# Patient Record
Sex: Female | Born: 1945
Health system: Southern US, Community
[De-identification: ages and names within clinical notes are randomized; demographics above are authoritative.]

## PROBLEM LIST (undated history)

## (undated) DIAGNOSIS — C4A30 Merkel cell carcinoma of unspecified part of face: Principal | ICD-10-CM

## (undated) DIAGNOSIS — F329 Major depressive disorder, single episode, unspecified: Secondary | ICD-10-CM

## (undated) DIAGNOSIS — M1990 Unspecified osteoarthritis, unspecified site: Secondary | ICD-10-CM

## (undated) DIAGNOSIS — F32A Depression, unspecified: Secondary | ICD-10-CM

## (undated) DIAGNOSIS — E785 Hyperlipidemia, unspecified: Secondary | ICD-10-CM

## (undated) DIAGNOSIS — K219 Gastro-esophageal reflux disease without esophagitis: Secondary | ICD-10-CM

## (undated) HISTORY — DX: Major depressive disorder, single episode, unspecified: F32.9

## (undated) HISTORY — DX: Hyperlipidemia, unspecified: E78.5

## (undated) HISTORY — DX: Unspecified osteoarthritis, unspecified site: M19.90

## (undated) HISTORY — PX: ABDOMINAL HYSTERECTOMY: SHX81

## (undated) HISTORY — PX: SHOULDER SURGERY: SHX246

## (undated) HISTORY — DX: Depression, unspecified: F32.A

## (undated) HISTORY — DX: Merkel cell carcinoma of unspecified part of face: C4A.30

## (undated) HISTORY — DX: Gastro-esophageal reflux disease without esophagitis: K21.9

---

## 1997-01-22 DIAGNOSIS — F329 Major depressive disorder, single episode, unspecified: Secondary | ICD-10-CM | POA: Insufficient documentation

## 1997-12-14 ENCOUNTER — Encounter: Payer: Self-pay | Admitting: Orthopedic Surgery

## 1997-12-14 ENCOUNTER — Ambulatory Visit (HOSPITAL_COMMUNITY): Admission: RE | Admit: 1997-12-14 | Discharge: 1997-12-14 | Payer: Self-pay | Admitting: Orthopedic Surgery

## 2000-05-01 ENCOUNTER — Encounter: Payer: Self-pay | Admitting: Family Medicine

## 2000-05-01 ENCOUNTER — Encounter: Admission: RE | Admit: 2000-05-01 | Discharge: 2000-05-01 | Payer: Self-pay | Admitting: Family Medicine

## 2000-07-18 ENCOUNTER — Encounter: Payer: Self-pay | Admitting: Family Medicine

## 2000-07-18 ENCOUNTER — Encounter: Admission: RE | Admit: 2000-07-18 | Discharge: 2000-07-18 | Payer: Self-pay | Admitting: Family Medicine

## 2004-07-21 ENCOUNTER — Ambulatory Visit: Payer: Self-pay | Admitting: Family Medicine

## 2005-11-09 ENCOUNTER — Ambulatory Visit: Payer: Self-pay | Admitting: Family Medicine

## 2005-11-12 ENCOUNTER — Encounter: Admission: RE | Admit: 2005-11-12 | Discharge: 2005-11-12 | Payer: Self-pay | Admitting: Family Medicine

## 2006-01-22 ENCOUNTER — Encounter: Payer: Self-pay | Admitting: Family Medicine

## 2006-01-22 LAB — CONVERTED CEMR LAB: Pap Smear: NORMAL

## 2006-02-13 ENCOUNTER — Other Ambulatory Visit: Admission: RE | Admit: 2006-02-13 | Discharge: 2006-02-13 | Payer: Self-pay | Admitting: Family Medicine

## 2006-02-14 ENCOUNTER — Ambulatory Visit: Payer: Self-pay | Admitting: Family Medicine

## 2006-02-14 LAB — CONVERTED CEMR LAB
CO2: 31 meq/L (ref 19–32)
Calcium: 9.6 mg/dL (ref 8.4–10.5)
Chloride: 106 meq/L (ref 96–112)
Creatinine, Ser: 0.8 mg/dL (ref 0.4–1.2)
Direct LDL: 148 mg/dL
Eosinophils Relative: 2.6 % (ref 0.0–5.0)
Glucose, Bld: 102 mg/dL — ABNORMAL HIGH (ref 70–99)
H Pylori IgG: POSITIVE — AB
HCT: 40.5 % (ref 36.0–46.0)
MCV: 93.3 fL (ref 78.0–100.0)
Neutrophils Relative %: 54.1 % (ref 43.0–77.0)
RBC: 4.34 M/uL (ref 3.87–5.11)
RDW: 12.6 % (ref 11.5–14.6)
Total CHOL/HDL Ratio: 3.8
Triglycerides: 125 mg/dL (ref 0–149)
WBC: 5.7 10*3/uL (ref 4.5–10.5)

## 2006-02-26 ENCOUNTER — Ambulatory Visit: Payer: Self-pay | Admitting: Internal Medicine

## 2006-03-01 ENCOUNTER — Encounter (INDEPENDENT_AMBULATORY_CARE_PROVIDER_SITE_OTHER): Payer: Self-pay | Admitting: *Deleted

## 2006-03-01 ENCOUNTER — Ambulatory Visit: Payer: Self-pay | Admitting: Internal Medicine

## 2006-03-20 ENCOUNTER — Ambulatory Visit: Payer: Self-pay | Admitting: Internal Medicine

## 2006-03-20 ENCOUNTER — Encounter: Payer: Self-pay | Admitting: Family Medicine

## 2006-03-25 ENCOUNTER — Ambulatory Visit (HOSPITAL_COMMUNITY): Admission: RE | Admit: 2006-03-25 | Discharge: 2006-03-25 | Payer: Self-pay | Admitting: Internal Medicine

## 2006-06-12 ENCOUNTER — Ambulatory Visit: Payer: Self-pay | Admitting: Internal Medicine

## 2006-06-13 ENCOUNTER — Encounter: Admission: RE | Admit: 2006-06-13 | Discharge: 2006-06-13 | Payer: Self-pay | Admitting: Family Medicine

## 2006-06-19 ENCOUNTER — Encounter (INDEPENDENT_AMBULATORY_CARE_PROVIDER_SITE_OTHER): Payer: Self-pay | Admitting: *Deleted

## 2007-02-21 ENCOUNTER — Telehealth (INDEPENDENT_AMBULATORY_CARE_PROVIDER_SITE_OTHER): Payer: Self-pay | Admitting: *Deleted

## 2007-02-25 ENCOUNTER — Encounter: Payer: Self-pay | Admitting: Family Medicine

## 2007-02-25 DIAGNOSIS — G8929 Other chronic pain: Secondary | ICD-10-CM

## 2007-02-25 DIAGNOSIS — K219 Gastro-esophageal reflux disease without esophagitis: Secondary | ICD-10-CM

## 2007-03-11 ENCOUNTER — Ambulatory Visit: Payer: Self-pay | Admitting: Family Medicine

## 2007-03-11 DIAGNOSIS — M810 Age-related osteoporosis without current pathological fracture: Secondary | ICD-10-CM

## 2007-03-11 DIAGNOSIS — Z87891 Personal history of nicotine dependence: Secondary | ICD-10-CM | POA: Insufficient documentation

## 2007-03-11 DIAGNOSIS — E785 Hyperlipidemia, unspecified: Secondary | ICD-10-CM | POA: Insufficient documentation

## 2007-03-12 LAB — CONVERTED CEMR LAB
Alkaline Phosphatase: 70 units/L (ref 39–117)
BUN: 12 mg/dL (ref 6–23)
Basophils Relative: 0.7 % (ref 0.0–1.0)
Bilirubin, Direct: 0.1 mg/dL (ref 0.0–0.3)
CO2: 30 meq/L (ref 19–32)
Cholesterol: 207 mg/dL (ref 0–200)
Direct LDL: 122.8 mg/dL
GFR calc Af Amer: 94 mL/min
Hemoglobin: 13.1 g/dL (ref 12.0–15.0)
Lymphocytes Relative: 25 % (ref 12.0–46.0)
MCHC: 33 g/dL (ref 30.0–36.0)
Monocytes Absolute: 0.5 10*3/uL (ref 0.2–0.7)
Monocytes Relative: 8.3 % (ref 3.0–11.0)
Neutro Abs: 4.2 10*3/uL (ref 1.4–7.7)
Neutrophils Relative %: 63.3 % (ref 43.0–77.0)
Potassium: 4.5 meq/L (ref 3.5–5.1)
Sodium: 141 meq/L (ref 135–145)
TSH: 0.88 microintl units/mL (ref 0.35–5.50)
Total Protein: 6.9 g/dL (ref 6.0–8.3)
VLDL: 14 mg/dL (ref 0–40)

## 2007-03-14 ENCOUNTER — Ambulatory Visit: Payer: Self-pay | Admitting: Family Medicine

## 2007-06-20 ENCOUNTER — Telehealth: Payer: Self-pay | Admitting: Family Medicine

## 2007-07-29 ENCOUNTER — Telehealth: Payer: Self-pay | Admitting: Family Medicine

## 2007-12-04 ENCOUNTER — Telehealth: Payer: Self-pay | Admitting: Family Medicine

## 2008-04-02 ENCOUNTER — Telehealth: Payer: Self-pay | Admitting: Family Medicine

## 2008-08-04 ENCOUNTER — Telehealth: Payer: Self-pay | Admitting: Family Medicine

## 2008-10-04 ENCOUNTER — Telehealth: Payer: Self-pay | Admitting: Family Medicine

## 2008-11-11 ENCOUNTER — Telehealth: Payer: Self-pay | Admitting: Family Medicine

## 2008-12-14 ENCOUNTER — Ambulatory Visit: Payer: Self-pay | Admitting: Family Medicine

## 2008-12-14 DIAGNOSIS — I8390 Asymptomatic varicose veins of unspecified lower extremity: Secondary | ICD-10-CM

## 2008-12-14 DIAGNOSIS — G4700 Insomnia, unspecified: Secondary | ICD-10-CM

## 2008-12-14 DIAGNOSIS — M79609 Pain in unspecified limb: Secondary | ICD-10-CM

## 2008-12-20 LAB — CONVERTED CEMR LAB
AST: 21 units/L (ref 0–37)
Albumin: 4.2 g/dL (ref 3.5–5.2)
BUN: 10 mg/dL (ref 6–23)
CO2: 32 meq/L (ref 19–32)
Chloride: 106 meq/L (ref 96–112)
Cholesterol: 237 mg/dL — ABNORMAL HIGH (ref 0–200)
Creatinine, Ser: 0.7 mg/dL (ref 0.4–1.2)
Direct LDL: 151.1 mg/dL
Eosinophils Absolute: 0.1 10*3/uL (ref 0.0–0.7)
Glucose, Bld: 90 mg/dL (ref 70–99)
HCT: 40.4 % (ref 36.0–46.0)
Lymphs Abs: 2 10*3/uL (ref 0.7–4.0)
MCHC: 33.4 g/dL (ref 30.0–36.0)
MCV: 95.4 fL (ref 78.0–100.0)
Monocytes Absolute: 0.5 10*3/uL (ref 0.1–1.0)
Neutrophils Relative %: 49.2 % (ref 43.0–77.0)
Platelets: 277 10*3/uL (ref 150.0–400.0)
Potassium: 4.5 meq/L (ref 3.5–5.1)
RDW: 12.9 % (ref 11.5–14.6)
TSH: 1.03 microintl units/mL (ref 0.35–5.50)
Total Bilirubin: 0.8 mg/dL (ref 0.3–1.2)
VLDL: 26 mg/dL (ref 0.0–40.0)

## 2008-12-21 LAB — CONVERTED CEMR LAB: Vit D, 25-Hydroxy: 20 ng/mL — ABNORMAL LOW (ref 30–89)

## 2008-12-22 ENCOUNTER — Encounter: Payer: Self-pay | Admitting: Family Medicine

## 2009-01-10 ENCOUNTER — Telehealth: Payer: Self-pay | Admitting: Family Medicine

## 2009-01-11 ENCOUNTER — Encounter: Payer: Self-pay | Admitting: Family Medicine

## 2009-01-22 LAB — HM DEXA SCAN

## 2009-01-26 ENCOUNTER — Encounter: Admission: RE | Admit: 2009-01-26 | Discharge: 2009-01-26 | Payer: Self-pay | Admitting: Family Medicine

## 2009-02-16 ENCOUNTER — Ambulatory Visit: Payer: Self-pay | Admitting: Family Medicine

## 2009-02-16 DIAGNOSIS — E559 Vitamin D deficiency, unspecified: Secondary | ICD-10-CM | POA: Insufficient documentation

## 2009-03-02 ENCOUNTER — Telehealth: Payer: Self-pay | Admitting: Family Medicine

## 2009-03-14 ENCOUNTER — Ambulatory Visit: Payer: Self-pay | Admitting: Family Medicine

## 2009-03-15 ENCOUNTER — Telehealth: Payer: Self-pay | Admitting: Family Medicine

## 2009-03-16 ENCOUNTER — Telehealth: Payer: Self-pay | Admitting: Family Medicine

## 2009-06-22 ENCOUNTER — Ambulatory Visit: Payer: Self-pay | Admitting: Family Medicine

## 2009-06-23 ENCOUNTER — Encounter: Payer: Self-pay | Admitting: Family Medicine

## 2009-06-23 LAB — CONVERTED CEMR LAB: Vit D, 25-Hydroxy: 33 ng/mL (ref 30–89)

## 2009-09-13 ENCOUNTER — Telehealth: Payer: Self-pay | Admitting: Family Medicine

## 2010-02-12 ENCOUNTER — Encounter: Payer: Self-pay | Admitting: Family Medicine

## 2010-02-21 NOTE — Consult Note (Signed)
Summary: [facility]  [facility]   Imported By: Lanelle Bal 01/25/2009 11:54:50  _____________________________________________________________________  External Attachment:    Type:   Image     Comment:   External Document

## 2010-02-21 NOTE — Progress Notes (Signed)
Summary: refill request for ambien  Phone Note Refill Request Message from:  Fax from Pharmacy  Refills Requested: Medication #1:  AMBIEN 10 MG  TABS 1 by mouth at bedtime prn   Last Refilled: 02/14/2009 Faxed request from [facility], phone [phone]  Initial call taken by: Lowella Petties CMA,  March 15, 2009 11:22 AM  Follow-up for Phone Call        px written on EMR for call in  Follow-up by: Judith Part MD,  March 15, 2009 11:35 AM  Additional Follow-up for Phone Call Additional follow up Details #1::        Medication phoned to [facility] pharmacy as instructed. Lewanda Rife LPN  March 15, 2009 12:59 PM     New/Updated Medications: AMBIEN 10 MG  TABS (ZOLPIDEM TARTRATE) 1 by mouth at bedtime prn Prescriptions: AMBIEN 10 MG  TABS (ZOLPIDEM TARTRATE) 1 by mouth at bedtime prn  #30 x 5   Entered by:   Lewanda Rife LPN   Authorized by:   Judith Part MD   Signed by:   Lewanda Rife LPN on 62/37/6283   Method used:   Telephoned to ...       [facility] [phone]* (retail)       [address] [place], [place]  [address]       Ph: [phone]       Fax: [phone]   RxID:   [id number]

## 2010-02-21 NOTE — Progress Notes (Signed)
Summary: [name] [name]  Phone Note Refill Request Message from:  Patient on September 13, 2009 11:33 AM  Refills Requested: Medication #1:  AMBIEN 10 MG  TABS 1 by mouth at bedtime prn Refill request from [facility]. [phone].   Initial call taken by: Melody Comas,  September 13, 2009 11:35 AM  Follow-up for Phone Call        px written on EMR for call in  Follow-up by: Judith Part MD,  September 13, 2009 12:27 PM  Additional Follow-up for Phone Call Additional follow up Details #1::        Rx called to [facility] at [facility] [place] [facility]. Additional Follow-up by: Linde Gillis CMA Dittrich Dull),  September 13, 2009 2:44 PM    Prescriptions: AMBIEN 10 MG  TABS (ZOLPIDEM TARTRATE) 1 by mouth at bedtime prn  #30 x 5   Entered and Authorized by:   Judith Part MD   Signed by:   Linde Gillis CMA (AAMA) on 09/13/2009   Method used:   Telephoned to ...       [facility] [phone]* (retail)       [address] [place], [place]  [address]       Ph: [phone]       Fax: [phone]   RxID:   [id number]

## 2010-02-21 NOTE — Assessment & Plan Note (Signed)
Summary: ROA TO DISCUSS BONE DENSITY  CYD   Vital Signs:  Patient profile:   65 year old female Weight:      191 pounds Temp:     97.9 degrees F oral Pulse rate:   68 / minute Pulse rhythm:   regular BP sitting:   120 / 70  (left arm) Cuff size:   regular  Vitals Entered By: Lowella Petties CMA (February 16, 2009 12:05 PM) CC: Discuss bone density results   History of Present Illness: bone densitty is lower  T score for spine is -3 and fem neck -1.6  on high dose vit D -- good  and taking her calcium  is walking exercise-- and is loosing wt with that   not hunched over   no family histrory  she used to smoke   no high risk climbing or activities  started recently on lamasil for her toenails-- Dr Terri Piedra    Allergies: 1)  ! Neurontin 2)  ! Nsaids  Past History:  Past Surgical History: Last updated: 02/25/2007 TAH/RSO endometriosis R) shoulder surgery DJD lumbar spine 3/00 MRI back 11/99 osteopenia dexa 6/02 gallstone CCY 2007 colonos polyps, tics sedation prob 2/08  Family History: Last updated: 03/11/2007 father DM and lung ca  sister breast ca brother lung ca PGF CAD uncles CAD GM stomach cancer MGF cancer ? type- poss prstate   Social History: Last updated: 03/11/2007 quit smoking 2/07 Former Smoker  Risk Factors: Smoking Status: quit (03/11/2007) Packs/Day: 02/22/2005 (02/25/2007)  Past Medical History: GERD Depression (01/22/1997) Osteoarthritis (11/1997) mild hyperlipidemia fam hx of breast cancer  osteoporosis  onchomycosis  vit D def   derm- [place]   Review of Systems General:  Denies loss of appetite and malaise. Eyes:  Denies blurring. CV:  Denies chest pain or discomfort and palpitations. GI:  Denies abdominal pain, indigestion, and nausea. MS:  Denies joint pain, loss of strength, and cramps. Derm:  Denies lesion(s) and rash. Endo:  Denies cold intolerance and heat intolerance.  Physical Exam  General:  overweight  but generally well appearing  Head:  normocephalic, atraumatic, and no abnormalities observed.   Neck:  No deformities, masses, or tenderness noted. Heart:  Normal rate and regular rhythm. S1 and S2 normal without gallop, murmur, click, rub or other extra sounds. Msk:  no kyphosis pt does not have petite frame  Extremities:  No clubbing, cyanosis, edema, or deformity noted with normal full range of motion of all joints.   Neurologic:  sensation intact to light touch, gait normal, and DTRs symmetrical and normal.   Skin:  Intact without suspicious lesions or rashes Cervical Nodes:  No lymphadenopathy noted Psych:  normal affect, talkative and pleasant    Impression & Recommendations:  Problem # 1:  OSTEOPOROSIS (ICD-733.00) Assessment Deteriorated disc OP and safety issues - handout given from aafp suppl vit D and re check 2 wk disc ca intake and exercise/ wt bearing  start fosamax- update if side eff  dexa plan 2 y Her updated medication list for this problem includes:    Vitamin D (ergocalciferol) 50000 Unit Caps (Ergocalciferol) .Marland Kitchen... Take one by mouth weekly times 10 weeks.    Fosamax 70 Mg Tabs (Alendronate sodium) .Marland Kitchen... 1 by mouth once weekly as directed  Orders: Prescription Created Electronically [phone])  Problem # 2:  UNSPECIFIED VITAMIN D DEFICIENCY (ICD-268.9) Assessment: New finishing 12 wk course of high dose tx  check lab 2 wk and update  Complete Medication List: 1)  Ambien 10  Mg Tabs (Zolpidem tartrate) .Marland Kitchen.. 1 by mouth at bedtime prn 2)  Omeprazole 20 Mg Cpdr (Omeprazole) .... Take one by mouth bid 3)  Vitamin D (ergocalciferol) 50000 Unit Caps (Ergocalciferol) .... Take one by mouth weekly times 10 weeks. 4)  Terbinafine Hcl 250 Mg Tabs (Terbinafine hcl) .... Take one by mouth daily 5)  Fosamax 70 Mg Tabs (Alendronate sodium) .Marland Kitchen.. 1 by mouth once weekly as directed  Patient Instructions: 1)  start fosamax once weekly  2)  if side effects like acid reflux or  trouble swallowing - stop it and update me  3)  will plan next bone density test in 2 years 4)  continue calcium and vitamin D  5)  schedule labs in 2 weeks for vitamin D level for deficiency  6)  work on weight bearing exercise like walking  Prescriptions: FOSAMAX 70 MG TABS (ALENDRONATE SODIUM) 1 by mouth once weekly as directed  #4 x 11   Entered and Authorized by:   Judith Part MD   Signed by:   Judith Part MD on 02/16/2009   Method used:   Electronically to        [id number]* (retail)       [address] [place], [place]  [address]       Ph: [phone]       Fax: [phone]   RxID:   [id number]   Prior Medications (reviewed today): AMBIEN 10 MG  TABS (ZOLPIDEM TARTRATE) 1 by mouth at bedtime prn OMEPRAZOLE 20 MG  CPDR (OMEPRAZOLE) take one by mouth bid VITAMIN D (ERGOCALCIFEROL) 50000 UNIT CAPS (ERGOCALCIFEROL) take one by mouth weekly times 10 weeks. TERBINAFINE HCL 250 MG TABS (TERBINAFINE HCL) take one by mouth daily Current Allergies: ! NEURONTIN ! NSAIDS

## 2010-02-21 NOTE — Progress Notes (Signed)
Summary: Indigestion  Phone Note Call from Patient Call back at [phone]   Caller: Patient Call For: Judith Part MD Summary of Call: Since pt has started Fosamax weekly pt states burps several times a day every day. Omeprazole is not helping. Pt has also tried Tums OTC.Pt uses [facility] on [facility] [address] if pharmacy needed. Please advise.  Initial call taken by: Lewanda Rife LPN,  March 16, 2009 2:54 PM  Follow-up for Phone Call        is it just burping or any trouble swallowing / chest pain/ or heartburn ?  Follow-up by: Judith Part MD,  March 16, 2009 4:43 PM  Additional Follow-up for Phone Call Additional follow up Details #1::        No chest pain and no trouble swallowing. Pt does have heart burn and when she burps it comes up in her throat where she can taste it.Lewanda Rife LPN  March 16, 2009 5:08 PM   I want her to stop the fosamax for 2 weeks and then update me with how symptoms are (hold the fosamax) Additional Follow-up by: Judith Part MD,  March 16, 2009 5:24 PM    Additional Follow-up for Phone Call Additional follow up Details #2::    Patient notified as instructed by telephone. Lewanda Rife LPN  March 16, 2009 5:33 PM   New/Updated Medications: FOSAMAX 70 MG TABS (ALENDRONATE SODIUM) 1 by mouth once weekly as directed (holding 2/23 for heartburn)

## 2010-02-21 NOTE — Miscellaneous (Signed)
Summary: Vitamin D3 2000iu update med list  Medications Added VITAMIN D3 2000 UNIT TABS (CHOLECALCIFEROL) Take 1 tablet by mouth once a day       Clinical Lists Changes  Medications: Added new medication of VITAMIN D3 2000 UNIT TABS (CHOLECALCIFEROL) Take 1 tablet by mouth once a day     Current Allergies: ! NEURONTIN ! NSAIDS

## 2010-02-21 NOTE — Progress Notes (Signed)
Summary: wants labs drawn here  Phone Note Call from Patient Call back at [phone]   Caller: Patient Call For: Judith Part MD Summary of Call: Pt is coming in later this month to have her vitamin d level checked.  She has an order from Dr. Terri Piedra to have her liver checked because she is taking lamisil.  She is asking if we can do the liver tests so that she wont have to go to another lab. Initial call taken by: Lowella Petties CMA,  March 02, 2009 11:29 AM  Follow-up for Phone Call        Jamesetta So - are we allowed to do that? Follow-up by: Judith Part MD,  March 02, 2009 12:03 PM  Additional Follow-up for Phone Call Additional follow up Details #1::        The doctor who ordered labs will need to follow lab results.  The labs performed at this site are for patients who are patients at this site and for follow up and treatment for visits at this site.  I would suggest the patient call the doctor's office who ordered the labs and have them performed at that office. Additional Follow-up by: Clarisa Schools,  March 16, 2009 11:48 AM    Additional Follow-up for Phone Call Additional follow up Details #2::    please let her know this is office manager's policy -- thanks Follow-up by: Judith Part MD,  March 16, 2009 12:13 PM  Additional Follow-up for Phone Call Additional follow up Details #3:: Details for Additional Follow-up Action Taken: Left message for patient to call back.Lewanda Rife LPN  March 16, 2009 2:15 PM   Patient notified as instructed by telephone. Lewanda Rife LPN  March 16, 2009 2:52 PM

## 2010-03-03 ENCOUNTER — Other Ambulatory Visit: Payer: Self-pay | Admitting: Family Medicine

## 2010-03-03 DIAGNOSIS — Z1231 Encounter for screening mammogram for malignant neoplasm of breast: Secondary | ICD-10-CM

## 2010-03-14 ENCOUNTER — Telehealth: Payer: Self-pay | Admitting: Family Medicine

## 2010-03-21 NOTE — Progress Notes (Signed)
Summary: refill request for ambien  Phone Note Refill Request Message from:  Pharmacy  Refills Requested: Medication #1:  AMBIEN 10 MG  TABS 1 by mouth at bedtime prn   Last Refilled: 02/13/2010 Phoned [facility] from [facility].  Phone [phone].  Initial call taken by: Lowella Petties CMA, AAMA,  March 14, 2010 12:06 PM  Follow-up for Phone Call        px written on EMR for call in  Follow-up by: Judith Part MD,  March 14, 2010 2:02 PM  Additional Follow-up for Phone Call Additional follow up Details #1::        Rx called to pharmacy. Additional Follow-up by: Sydell Axon LPN,  March 14, 2010 3:55 PM    Prescriptions: AMBIEN 10 MG  TABS (ZOLPIDEM TARTRATE) 1 by mouth at bedtime prn  #30 x 5   Entered and Authorized by:   Judith Part MD   Signed by:   Judith Part MD on 03/14/2010   Method used:   Telephoned to ...       [facility] [phone]* (retail)       [address] [place], [place]  [address]       Ph: [phone]       Fax: [phone]   RxID:   [id number]

## 2010-03-22 ENCOUNTER — Ambulatory Visit
Admission: RE | Admit: 2010-03-22 | Discharge: 2010-03-22 | Disposition: A | Discharge status: Home / Self Care | Payer: BC Managed Care – PPO | Source: Ambulatory Visit | Attending: Family Medicine | Admitting: Family Medicine

## 2010-03-22 DIAGNOSIS — Z1231 Encounter for screening mammogram for malignant neoplasm of breast: Secondary | ICD-10-CM

## 2010-03-24 ENCOUNTER — Encounter (INDEPENDENT_AMBULATORY_CARE_PROVIDER_SITE_OTHER): Payer: Self-pay | Admitting: *Deleted

## 2010-03-30 NOTE — Miscellaneous (Signed)
Summary: Mammogram to flowsheet   Clinical Lists Changes  Observations: Added new observation of MAMMO DUE: 03/2011 (03/24/2010 10:04) Added new observation of MAMMOGRAM: normal (03/22/2010 10:05)      Preventive Care Screening  Mammogram:    Date:  03/22/2010    Next Due:  03/2011    Results:  normal

## 2010-03-30 NOTE — Letter (Signed)
Summary: Results Follow up Letter  [place] [facility]  [address] [place], [place] [address]   Phone: [phone]  Fax: [phone]    03/24/2010 MRN: [id number]     [name] [name] [address] [place], [place]  [address]       Dear [name] [name],  The following are the results of your recent test(s):  Test         Result    Pap Smear:        Normal _____  Not Normal _____ Comments: ______________________________________________________ Cholesterol: LDL(Bad cholesterol):         Your goal is less than:         HDL (Good cholesterol):       Your goal is more than: Comments:  ______________________________________________________ Mammogram:        Normal __X___  Not Normal _____ Comments: Repeat in 1 year  ___________________________________________________________________ Hemoccult:        Normal _____  Not normal _______ Comments:    _____________________________________________________________________ Other Tests:    We routinely do not discuss normal results over the telephone.  If you desire a copy of the results, or you have any questions about this information we can discuss them at your next office visit.   Sincerely,      Sharilyn Sites for  Dr. Roxy Manns

## 2010-06-06 NOTE — Assessment & Plan Note (Signed)
[facility]                         GASTROENTEROLOGY OFFICE NOTE   NAME:[name], [name] [name]                      MRN:          [id number]  DATE:06/12/2006                            DOB:          1945/09/14    CHIEF COMPLAINT:  Soreness in her neck and throat again.   HISTORY:  [name] [name] was no Nexium b.i.d., which helped her sore throat  symptoms, so I think she did have some reflux.  She still has chronic  right neck pain that she has had for four years, previously evaluated by  Dr. Kathy Breach.  Her formulary would not allow her to continue on  Nexium.  She gets abdominal pain if she eats [place] food, so she is  avoiding that.  There are no other changes.   MEDICATIONS:  Listed and reviewed in the chart.   PAST MEDICAL HISTORY:  1. Globus and sore throat symptoms, responded to Nexium.  2. Incomplete colonoscopy has shown diverticulosis and two tiny rectal      polyps that were hyperplastic.  Plan for a possible CT colonoscopy      or propofol colonoscopy to complete a colonoscopy in one year.      That was her decision.  3. Barium swallow showing marked esophageal dysmotility on March 25, 2006. A small sliding hiatal hernia.  4. Insomnia.  5. H. pylori, previously treated with Prev Pak.  6. Prior hysterectomy.  7. Reflex sympathetic dystrophy in the right arm, secondary to rotator      cuff surgery plus/minus previous injury.  8. Prior cholecystectomy.  9. Dyslipidemia.  10.Osteoarthritis.  11.Obesity.  12.Sleep apnea.  13.Allergy and sinus problems.   PHYSICAL EXAMINATION:  VITAL SIGNS:  Weight 185 pounds, pulse 64, blood  pressure 122/74.   ASSESSMENT:  1. Atypical reflux problems.  It sounds like the sore throat was      reflux, since the Nexium helped.  2. Formulary issues.  3. Chronic right neck pain.  I am not sure what that is from.  4. Otherwise as above.   PLAN:  1. Try Omeprazole 20 mg b.i.d. before breakfast and supper.   If that      is not working in two months, she is to call back and we can go to      Aciphex 20 mg b.i.d.  She wanted to try the most cost-effective      approach first.  2. Continue with plans for a recall about her colon examination next      year.  3. She also wondered if allergies are a part of her problem.  I      suggested that she could try over-the-counter loratadine on a daily      basis, to see if that helps the right neck pain.  To talk to Dr.      Idamae Schuller A. Tower or her otolaryngologist if that is not helpful      further.     Iva Boop, MD,FACG  Electronically Signed    CEG/MedQ  DD: 06/12/2006  DT: 06/12/2006  Job #:  [id number]   cc:   Marne A. Milinda Antis, MD

## 2010-06-09 NOTE — Assessment & Plan Note (Signed)
[facility]                         GASTROENTEROLOGY OFFICE NOTE   NAME:[name] [name], [name] [name]                      MRN:          [id number]  DATE:03/20/2006                            DOB:          08-30-1945    CHIEF COMPLAINT:  Swollen burning throat and followup after colonoscopy.   HISTORY OF PRESENT ILLNESS:  Ms. Giaimo had a very difficult  colonoscopy. I could see the cecum but not enter it. She had some  hyperplastic polyps. She was quite uncomfortable during the procedure  and says that she does not want to do another one of those again. She is  not on any proton pump inhibitors at this time. She took a Prev-pack for  H-pylori positive serology and did feel somewhat better when she was on  that, though did not have complete relief. She had hyperplastic rectal  polyps, as well as sigmoid diverticulosis. She had a fixed left colon.   PAST MEDICAL HISTORY:  See my note of February 26, 2006 and above.   PHYSICAL EXAMINATION:  VITAL SIGNS:  Weight 192 pounds. Pulse 68. Blood  pressure 118/78.   ASSESSMENT:  1. Persistent globus and sore throat symptoms. Question related to      reflux. It is possible, though many times, not.  2. Diverticulosis on colonoscopy. Colonoscopy to the cecum but did not      enter, so the cecum is not entirely cleared.   PLAN:  1. Barium swallow with tablet.  2. Nexium 40 mg b.i.d. for 2 months at least, so see if that helps her      sore throat symptoms, then I will see her back.  3. Call with the barium swallow results.  4. Recall regarding possible CT colonoscopy or propofol colonoscopy in      the next year. She understands that I did not get 100% complete      evaluation of her colon and that it is unlikely that there is a      cancer there. There could be neoplastic lesions that could lead to      colon cancer. She does not want to proceed with any other testing      like barium enema or CT colonoscopy at this  time.     Iva Boop, MD,FACG  Electronically Signed    CEG/MedQ  DD: 03/20/2006  DT: 03/20/2006  Job #: [id number]   cc:   Marne A. Milinda Antis, MD

## 2010-06-09 NOTE — Assessment & Plan Note (Signed)
[facility]                         GASTROENTEROLOGY OFFICE NOTE   NAME:[name], [name] OSTERBERG                      MRN:          [id number]  DATE:02/26/2006                            DOB:          Jul 21, 1945    REASON FOR CONSULTATION:  Stomach pain, burning in throat.   ASSESSMENT:  A 65 year old white woman with some globus sensation,  heartburn, and neck and perhaps some chest pain.  This certainly could  be atypical reflux.  She has had the globus sensation for a number of  years.  It is only on the right side.  She has had heartburn symptoms  and acid reflux symptoms and burning in her throat.  They are not  associated with eating.  They are somewhat better after a recent  cholecystectomy.  She is currently halfway through a course of Prevpac  and cannot really tell me if that has changed things much.  She had  previously taken Zegerid daily and Prevacid daily with some but  incomplete benefit.  There does not seem to be hoarseness or respiratory  difficulty.   She is also of age for colon cancer screening.  Her mother did have  colon polyps.   PLAN:  1. Schedule screening colonoscopy.  2. Finish Prevpac.  3. Reassess after she finishes Prevpac.  She probably needs a course      of a b.i.d. proton pump inhibitor.  If she fails that, then Bravo      pH monitoring would be appropriate.  Would perform an upper      endoscopy and locate the Z-line, and go from there.  There is no      dysphagia, there is no weight loss, there are no worrisome features      otherwise at this point to suggest a need for an upper GI endoscopy      now.  A barium swallow could be useful as far as evaluating the      globus and looking for motility disturbance.   Risks, benefits, indications of colonoscopy are explained.  She  understands and agrees to proceed.   HISTORY:  A 65 year old white woman who has the problems as outlined  above.  She had her gallbladder out  because of some cholelithiasis and  right upper quadrant pain and does feel better with respect to that, but  still has this burning sensation in her throat and sometimes in the  chest.  She does not really regurgitate much.  There is no nausea or  vomiting.  There is no dysphagia.  She was on Prevacid and then Zegerid  as described above, and is now on a Prevpac.  She has seen Dr. Elvera Maria  of otolaryngology because of the globus type sensation and his  examination was negative.  She says when Dr. Milinda Antis palpates her neck she  cannot palpate any problem.  She has some loose bowel movements since  her cholecystectomy that are somewhat better.  There is a remote history  of rectal bleeding.  She has not had a screening colonoscopy.   PAST MEDICAL HISTORY:  1.  Cholecystectomy recently as described above.  2. Hysterectomy.  3. Reflex sympathetic dystrophy in the right arm, related to a rotator      cuff surgery it sounds like, plus an injury.  4. Dyslipidemia.  5. Osteoarthritis.  6. Obesity.  7. Sleep apnea.  8. Allergy and sinus problems.   DRUG ALLERGIES:  None known.   CURRENT MEDICATIONS:  Prevpac and Ambien 10 mg.   FAMILY HISTORY:  Heart disease, prostate cancer, breast cancer,  diabetes.  No colon cancer reported.   SOCIAL HISTORY:  She is married, she is disabled.  Her mother-in-law is  ill and in the final stages of Alzheimer's and may die soon, the patient  indicates.  She has two sons and one daughter.  She quit smoking a year  ago.  There is no significant caffeine; that has been reduced recently,  however.  No alcohol use.   REVIEW OF SYSTEMS:  See medical history form for full details.   PHYSICAL EXAMINATION:  GENERAL:  Reveals an obese, pleasant, middle-aged  white woman.  Height 5 feet 7 inches, weight 189 pounds.  Blood pressure  122/78, pulse 95 and regular.  HEENT:  The eyes are anicteric, mouth free of lesions.  The posterior  pharynx shows some mild  erythema.  NECK:  Supple without mass or thyromegaly.  CHEST:  Clear, resonant.  HEART:  S1, S2.  I hear no rubs or gallops.  ABDOMEN:  Obese, soft, nontender.  No organomegaly or mass.  Note there  are well-healed laparoscopic cholecystectomy scars.  RECTAL:  Deferred.  EXTREMITIES:  Show no peripheral edema in the lower extremities.  The  right upper extremity is notable for decreased tone, decreases strength.  She does not really use that.  LYMPHATIC:  No neck or supraclavicular nodes palpated.  SKIN:  No acute rash.  PSYCHIATRIC:  She is alert and oriented x3.   I appreciate the opportunity to care for this patient.  I have reviewed  recent labs (CBC, CMET, ultrasound report) and office notes form Dr.  Milinda Antis.    Iva Boop, MD,FACG  Electronically Signed   CEG/MedQ  DD: 02/26/2006  DT: 02/26/2006  Job #: [id number]   cc:   Marne A. Milinda Antis, MD

## 2010-09-12 ENCOUNTER — Ambulatory Visit (INDEPENDENT_AMBULATORY_CARE_PROVIDER_SITE_OTHER): Payer: Medicare Other | Admitting: Family Medicine

## 2010-09-12 ENCOUNTER — Other Ambulatory Visit: Payer: Self-pay | Admitting: *Deleted

## 2010-09-12 ENCOUNTER — Encounter: Payer: Self-pay | Admitting: Family Medicine

## 2010-09-12 VITALS — BP 140/80 | HR 94 | Temp 97.6°F | Ht 67.0 in | Wt 193.8 lb

## 2010-09-12 DIAGNOSIS — M722 Plantar fascial fibromatosis: Secondary | ICD-10-CM

## 2010-09-12 MED ORDER — ZOLPIDEM TARTRATE 10 MG PO TABS: 10.0000 mg | ORAL_TABLET | Freq: Every evening | ORAL | Status: DC | PRN

## 2010-09-12 NOTE — Progress Notes (Signed)
  Subjective:    Patient ID: [name] [name], female    DOB: [birth year], 65 y.o.   MRN: [id number]  HPI  [name] [name], a 65 y.o. female presents today in the office for the following:    R heel -- hurts first thing in the morning. After on it for a while will hurt a lot. Has been hurting for about a month.  The patient presents with a 1 mo long history of heel pain. This is notable for worsening pain first thing in the morning when arising and standing after sitting.   Prior foot or ankle fractures: none Prior operations: none Orthotics or bracing: none Medications: none PT or home rehab: none Night splints: no Ice massage: no Ball massage: no  Metatarsal pain: no  The PMH, PSH, Social History, Family History, Medications, and allergies have been reviewed in [facility], and have been updated if relevant.  REVIEW OF SYSTEMS  GEN: No fevers, chills. Nontoxic. Primarily MSK c/o today. MSK: Detailed in the HPI GI: tolerating PO intake without difficulty Neuro: No numbness, parasthesias, or tingling associated. Otherwise the pertinent positives of the ROS are noted above.   PHYSICAL EXAM  Blood pressure 140/80, pulse 94, temperature 97.6 F (36.4 C), temperature source Oral, height 5\' 7"  (1.702 m), weight 193 lb 12.8 oz (87.907 kg), SpO2 97.00%.  GEN: Well-developed,well-nourished,in no acute distress; alert,appropriate and cooperative throughout examination HEENT: Normocephalic and atraumatic without obvious abnormalities. Ears, externally no deformities PULM: Breathing comfortably in no respiratory distress EXT: No clubbing, cyanosis, or edema PSYCH: Normally interactive. Cooperative during the interview. Pleasant. Friendly and conversant. Not anxious or depressed appearing. Normal, full affect.  Echymosis: no Edema: no ROM: full LE B Gait: heel toe, non-antalgic MT pain: no Callus pattern: none Lateral Mall: NT Medial Mall: NT Talus: NT Navicular: NT Calcaneous:  NT Metatarsals: NT 5th MT: NT Phalanges: NT Achilles: NT Plantar Fascia: tender, medial along PF. Pain with forced dorsi Fat Pad: NT Peroneals: NT Post Tib: NT Great Toe: Nml motion Ant Drawer: neg Other foot breakdown: none Long arch: preserved Hindfoot breakdown: none Sensation: intact  A/P: Plantar fascitis: We reviewed that stretching is critically important to the treatment of PF. Reviewed footwear. Rigid soles have been shown to help with PF.Marland Kitchen Reviewed rehab of stretching and calf raises.      Review of Systems     Objective:   Physical Exam        Assessment & Plan:

## 2010-09-12 NOTE — Telephone Encounter (Signed)
Medication phoned to [facility] on [address] as instructed. Patient notified as instructed by telephone.

## 2010-09-12 NOTE — Patient Instructions (Signed)
Please read handouts on Plantar Fascitis.  STRETCHING and Strengthening program critically important.  Strengthening on foot and calf muscles as seen in handout. Calf raises, 2 legged, then 1 legged. Foot massage with tennis ball. Ice massage.  Towel Scrunches: get a towel or hand towel, use toes to pick up and scrunch up the towel.  Marble pick-ups, practice picking up marbles with toes and placing into a cup  NEEDS TO BE DONE EVERY DAY  Recommended over the counter insoles. (Spenco or Hapad)  A rigid shoe with good arch support helps: Dansko (great), Lelon Frohlich, Clarks No easily bendable shoes.   Tuli's heel cups

## 2010-09-12 NOTE — Telephone Encounter (Signed)
Px written for call in   

## 2010-09-12 NOTE — Telephone Encounter (Signed)
Patient request refill

## 2010-10-13 ENCOUNTER — Telehealth: Payer: Self-pay | Admitting: *Deleted

## 2010-10-13 MED ORDER — ZOLPIDEM TARTRATE 10 MG PO TABS: 10.0000 mg | ORAL_TABLET | Freq: Every evening | ORAL | Status: DC | PRN

## 2010-10-13 NOTE — Telephone Encounter (Signed)
Px written for call in   

## 2010-10-13 NOTE — Telephone Encounter (Signed)
Medication phoned to pharmacy.  

## 2010-10-17 NOTE — Telephone Encounter (Signed)
[facility]olpidem script cancelled [facility].  Pt is in [facility] and actually wanted the script sent there.  [facility] in [place], they will fill once and transfer the 2 remaining [facility] [address].

## 2011-01-15 ENCOUNTER — Other Ambulatory Visit: Payer: Self-pay | Admitting: Internal Medicine

## 2011-01-15 MED ORDER — ZOLPIDEM TARTRATE 10 MG PO TABS: 10.0000 mg | ORAL_TABLET | Freq: Every evening | ORAL | Status: DC | PRN

## 2011-01-15 NOTE — Telephone Encounter (Signed)
Px written for call in   

## 2011-01-15 NOTE — Telephone Encounter (Signed)
[facility] sent this refill request Phone# [phone]

## 2011-01-15 NOTE — Telephone Encounter (Signed)
Medication phoned to Marylene Land at [facility] as instructed.

## 2011-02-15 ENCOUNTER — Other Ambulatory Visit: Payer: Self-pay | Admitting: *Deleted

## 2011-02-15 MED ORDER — ZOLPIDEM TARTRATE 10 MG PO TABS: 10.0000 mg | ORAL_TABLET | Freq: Every evening | ORAL | Status: DC | PRN

## 2011-02-15 NOTE — Telephone Encounter (Signed)
Medication phoned to [facility] as instructed with note pt needs to call for appt.

## 2011-02-15 NOTE — Telephone Encounter (Signed)
OK to refill? Requests refill to [facility] in [place].

## 2011-02-15 NOTE — Telephone Encounter (Signed)
Medication phoned to pharmacy.  Patient advised to schedule appt. 

## 2011-02-15 NOTE — Telephone Encounter (Signed)
I do not think I have seen her in quite a while - ? 2011 let me know if I'm wrong Will refil once Px written for call in  -- but no further refills until she follows up please- let her know

## 2011-03-12 ENCOUNTER — Encounter: Payer: Self-pay | Admitting: Family Medicine

## 2011-03-12 ENCOUNTER — Ambulatory Visit (INDEPENDENT_AMBULATORY_CARE_PROVIDER_SITE_OTHER): Payer: Medicare Other | Admitting: Family Medicine

## 2011-03-12 VITALS — BP 138/72 | HR 96 | Temp 97.8°F | Ht 67.0 in | Wt 197.2 lb

## 2011-03-12 DIAGNOSIS — G4700 Insomnia, unspecified: Secondary | ICD-10-CM

## 2011-03-12 MED ORDER — ZOLPIDEM TARTRATE 10 MG PO TABS: 10.0000 mg | ORAL_TABLET | Freq: Every evening | ORAL | Status: DC | PRN

## 2011-03-12 NOTE — Patient Instructions (Signed)
Here is refil of ambien -- use with caution If you are interested in a shingles/zoster vaccine - call your insurance to check on coverage,( you should not get it within 1 month of other vaccines) , then call us for a prescription  for it to take to a pharmacy that gives the shot or get here   don't forget to schedule your mammogram Schedule annual exam this summer when you are back

## 2011-03-12 NOTE — Progress Notes (Signed)
Subjective:    Patient ID: [name] [name], female    DOB: 1945-07-19, 66 y.o.   MRN: [id number]  HPI Here for f/u of insomnia and other chronic problems  Is feeling good  Nothing new going on   Does not see gyn  Had mammo 1 year ago  Lives [place] - looking for a house here (uses daughter's address)  bp is 138/72 Wt is up 4 lb with bmi of 30 Diet - is eating about the same Exercise- not as active as she was (plantar fasciitis)   Insomnia-- uses [place]- needs it every night  Severe insomnia 3-4 hours  Nothing else  Pays for it out of pocket  Has tried all otc med No side effects from the [place] Mood is good   Zoster status- is interested in vaccine if covered Knows she is due for health mt exam   Patient Active Problem List  Diagnoses  . UNSPECIFIED VITAMIN D DEFICIENCY  . HYPERLIPIDEMIA  . INSOMNIA, CHRONIC  . DEPRESSION  . OTHER CHRONIC PAIN  . VARICOSE VEINS, LOWER EXTREMITIES  . GERD  . LEG PAIN, BILATERAL  . OSTEOPOROSIS  . TOBACCO USE, QUIT   Past Medical History  Diagnosis Date  . GERD (gastroesophageal reflux disease)   . Depression   . Arthritis   . Hyperlipidemia   . Osteoporosis   . Vitamin d deficiency    Past Surgical History  Procedure Date  . Abdominal hysterectomy   . Shoulder surgery     right   History  Substance Use Topics  . Smoking status: Former Smoker    Quit date: 01/22/2005  . Smokeless tobacco: Not on file  . Alcohol Use: Not on file   Family History  Problem Relation Age of Onset  . Diabetes Father   . Cancer Father     lung cancer  . Cancer Sister     breast cancer  . Cancer Brother     lung cancer  . Coronary artery disease Paternal Uncle   . Cancer Maternal Grandfather     possible prostate  . Coronary artery disease Paternal Grandfather   . Heart disease Paternal Grandfather     CAD   Allergies  Allergen Reactions  . Gabapentin     REACTION: nausea  . Nsaids     REACTION: GI upset   Current  Outpatient Prescriptions on File Prior to Visit  Medication Sig Dispense Refill  . cholecalciferol (VITAMIN D) 1000 UNITS tablet Take 2,000 Units by mouth daily.           Review of Systems Review of Systems  Constitutional: Negative for fever, appetite change, and unexpected weight change. pos for fatigue from lack of sleep Eyes: Negative for pain and visual disturbance.  Respiratory: Negative for cough and shortness of breath.   Cardiovascular: Negative for cp or palpitations    Gastrointestinal: Negative for nausea, diarrhea and constipation.  Genitourinary: Negative for urgency and frequency.  Skin: Negative for pallor or rash   Neurological: Negative for weakness, light-headedness, numbness and headaches. pos for difficulty sleeping  Hematological: Negative for adenopathy. Does not bruise/bleed easily.  Psychiatric/Behavioral: Negative for dysphoric mood. The patient is not nervous/anxious.  pos for stressors        Objective:   Physical Exam  Constitutional: She appears well-developed and well-nourished. No distress.       overwt and well appearing   HENT:  Head: Normocephalic and atraumatic.  Mouth/Throat: Oropharynx is clear and moist.  Eyes: Conjunctivae and EOM are normal. Pupils are equal, round, and reactive to light. No scleral icterus.  Neck: Normal range of motion. Neck supple. No JVD present. Carotid bruit is not present. No thyromegaly present.  Cardiovascular: Normal rate, regular rhythm, normal heart sounds and intact distal pulses.  Exam reveals no gallop.   Pulmonary/Chest: Effort normal and breath sounds normal. No respiratory distress. She has no wheezes.  Abdominal: Soft. Bowel sounds are normal. She exhibits no distension and no abdominal bruit.  Musculoskeletal: She exhibits no edema.  Lymphadenopathy:    She has no cervical adenopathy.  Neurological: She is alert. She has normal reflexes. She exhibits normal muscle tone. Coordination normal.       No  tremor   Skin: Skin is warm and dry. No pallor.  Psychiatric: She has a normal mood and affect. Her behavior is normal.       Pleasant and talkative           Assessment & Plan:

## 2011-03-12 NOTE — Assessment & Plan Note (Signed)
Pt takes [place] daily- works well  No otc meds work No side eff Disc imp of exercise and caff avoidance and good sleep hygiene for insomnia  Overall doing well  Med refilled 6 mo  Summer f/u

## 2011-03-20 ENCOUNTER — Other Ambulatory Visit: Payer: Self-pay | Admitting: *Deleted

## 2011-03-20 NOTE — Telephone Encounter (Signed)
Received fax from pharmacy wanting to verify Rx for Ambien because patient wants this Rx filled out of stated.  Spoke to the pharmacist and the Rx is legit for Ambien 10mg .  They will just fill this one time only.

## 2011-08-24 ENCOUNTER — Other Ambulatory Visit: Payer: Self-pay | Admitting: Family Medicine

## 2011-08-24 DIAGNOSIS — Z1231 Encounter for screening mammogram for malignant neoplasm of breast: Secondary | ICD-10-CM

## 2011-08-27 ENCOUNTER — Other Ambulatory Visit: Payer: Medicare Other

## 2011-08-29 ENCOUNTER — Encounter: Payer: Medicare Other | Admitting: Family Medicine

## 2011-09-14 ENCOUNTER — Telehealth: Payer: Self-pay | Admitting: Family Medicine

## 2011-09-14 ENCOUNTER — Other Ambulatory Visit (INDEPENDENT_AMBULATORY_CARE_PROVIDER_SITE_OTHER): Payer: Medicare Other

## 2011-09-14 ENCOUNTER — Ambulatory Visit
Admission: RE | Admit: 2011-09-14 | Discharge: 2011-09-14 | Disposition: A | Discharge status: Home / Self Care | Payer: Medicare Other | Source: Ambulatory Visit | Attending: Family Medicine | Admitting: Family Medicine

## 2011-09-14 DIAGNOSIS — E559 Vitamin D deficiency, unspecified: Secondary | ICD-10-CM

## 2011-09-14 DIAGNOSIS — Z1231 Encounter for screening mammogram for malignant neoplasm of breast: Secondary | ICD-10-CM

## 2011-09-14 DIAGNOSIS — K219 Gastro-esophageal reflux disease without esophagitis: Secondary | ICD-10-CM

## 2011-09-14 DIAGNOSIS — M810 Age-related osteoporosis without current pathological fracture: Secondary | ICD-10-CM

## 2011-09-14 DIAGNOSIS — E785 Hyperlipidemia, unspecified: Secondary | ICD-10-CM

## 2011-09-14 LAB — COMPREHENSIVE METABOLIC PANEL
Albumin: 3.8 g/dL (ref 3.5–5.2)
Alkaline Phosphatase: 63 U/L (ref 39–117)
BUN: 16 mg/dL (ref 6–23)
Creatinine, Ser: 0.7 mg/dL (ref 0.4–1.2)
Glucose, Bld: 95 mg/dL (ref 70–99)
Potassium: 4.9 mEq/L (ref 3.5–5.1)

## 2011-09-14 LAB — CBC WITH DIFFERENTIAL/PLATELET
Basophils Relative: 1 % (ref 0.0–3.0)
Eosinophils Absolute: 0.2 10*3/uL (ref 0.0–0.7)
Eosinophils Relative: 3.5 % (ref 0.0–5.0)
Hemoglobin: 13 g/dL (ref 12.0–15.0)
MCHC: 32.4 g/dL (ref 30.0–36.0)
MCV: 94.3 fl (ref 78.0–100.0)
Monocytes Absolute: 0.7 10*3/uL (ref 0.1–1.0)
Neutro Abs: 2.5 10*3/uL (ref 1.4–7.7)
RBC: 4.24 Mil/uL (ref 3.87–5.11)
WBC: 5.5 10*3/uL (ref 4.5–10.5)

## 2011-09-14 LAB — LDL CHOLESTEROL, DIRECT: Direct LDL: 123.3 mg/dL

## 2011-09-14 NOTE — Telephone Encounter (Signed)
Message copied by Judy Pimple on Fri Sep 14, 2011  7:39 AM ------      Message from: Alvina Chou      Created: Tue Sep 11, 2011 11:04 AM      Regarding: Lab orders for Friday, 8.23.13       Patient is scheduled for CPX labs, please order future labs, Thanks , Camelia Eng

## 2011-09-15 LAB — VITAMIN D 25 HYDROXY (VIT D DEFICIENCY, FRACTURES): Vit D, 25-Hydroxy: 31 ng/mL (ref 30–89)

## 2011-09-18 ENCOUNTER — Ambulatory Visit (INDEPENDENT_AMBULATORY_CARE_PROVIDER_SITE_OTHER): Payer: Medicare Other | Admitting: Family Medicine

## 2011-09-18 ENCOUNTER — Encounter: Payer: Self-pay | Admitting: Family Medicine

## 2011-09-18 VITALS — BP 128/66 | HR 80 | Temp 97.6°F | Ht 66.0 in | Wt 195.8 lb

## 2011-09-18 DIAGNOSIS — E559 Vitamin D deficiency, unspecified: Secondary | ICD-10-CM

## 2011-09-18 DIAGNOSIS — E785 Hyperlipidemia, unspecified: Secondary | ICD-10-CM

## 2011-09-18 DIAGNOSIS — M79609 Pain in unspecified limb: Secondary | ICD-10-CM

## 2011-09-18 DIAGNOSIS — K219 Gastro-esophageal reflux disease without esophagitis: Secondary | ICD-10-CM

## 2011-09-18 DIAGNOSIS — M810 Age-related osteoporosis without current pathological fracture: Secondary | ICD-10-CM

## 2011-09-18 DIAGNOSIS — Z1211 Encounter for screening for malignant neoplasm of colon: Secondary | ICD-10-CM | POA: Insufficient documentation

## 2011-09-18 MED ORDER — TRAMADOL HCL 50 MG PO TABS: 50.0000 mg | ORAL_TABLET | Freq: Three times a day (TID) | ORAL | Status: AC | PRN

## 2011-09-18 MED ORDER — ZOLPIDEM TARTRATE 10 MG PO TABS: 10.0000 mg | ORAL_TABLET | Freq: Every evening | ORAL | Status: DC | PRN

## 2011-09-18 NOTE — Patient Instructions (Addendum)
Please do stool card for colon cancer screening  If you are interested in shingles vaccine in future - call your insurance company to see how coverage is and call us to schedule  Call back when you are ready to schedule your GI visit (to discuss colonoscopy) and also dexa (bone density test) Increase your daily over the counter vitamin D to 3000 iu per day Avoid red meat/ fried foods/ egg yolks/ fatty breakfast meats/ butter, cheese and high fat dairy/ and shellfish  - for cholesterol control Try tramadol occasionally for pain - do not mix with your Remus Loffler

## 2011-09-18 NOTE — Assessment & Plan Note (Signed)
Stable on omeprazole bid  She gets this otc  Will keep an eye on bone density

## 2011-09-18 NOTE — Progress Notes (Signed)
Subjective:    Patient ID: [name] [name], female    DOB: 12/20/1945, 66 y.o.   MRN: [id number]  HPI Here for check up of chronic medical conditions and to review health mt list  Summer has been great   No new medical questions - same chronic conditions  She has chronic back and leg pain- deals with that well  She tried her husband's tramadol for pain - as long as she does not mix it with the [place] was this mo- just came back neg Self exam-no lumps or changes  Has had a hysterectomy No gyn problems that she knows of - no itch or bleeding or dryiness   colonosc 2/08- had trouble with sedation at that time - and did not finish it  Not ready to do that yet -- is moving  Will do IFOB in the meantime   Zoster status - is interested in the vaccine  She will check with her insurance   dexa 2011 - is due for 2 year follow up  Pt is moving - so will call to sched that  OP Vit D level is ok-- 31 low level of normal  , taking ca also No fractures   Wt is stable with bmi of 31     Chemistry      Component Value Date/Time   NA 141 09/14/2011 0915   K 4.9 09/14/2011 0915   CL 108 09/14/2011 0915   CO2 29 09/14/2011 0915   BUN 16 09/14/2011 0915   CREATININE 0.7 09/14/2011 0915      Component Value Date/Time   CALCIUM 9.4 09/14/2011 0915   ALKPHOS 63 09/14/2011 0915   AST 17 09/14/2011 0915   ALT 14 09/14/2011 0915   BILITOT 0.5 09/14/2011 0915     Lab Results  Component Value Date   CHOL 210* 09/14/2011   HDL 68.30 09/14/2011   LDLDIRECT 123.3 09/14/2011   TRIG 82.0 09/14/2011   CHOLHDL 3 09/14/2011   chol ok -she does not watch diet   Lab Results  Component Value Date   WBC 5.5 09/14/2011   HGB 13.0 09/14/2011   HCT 40.0 09/14/2011   MCV 94.3 09/14/2011   PLT 282.0 09/14/2011   Lab Results  Component Value Date   TSH 2.09 09/14/2011    Patient Active Problem List  Diagnosis  . UNSPECIFIED VITAMIN D DEFICIENCY  . HYPERLIPIDEMIA  . INSOMNIA, CHRONIC  . DEPRESSION    . OTHER CHRONIC PAIN  . VARICOSE VEINS, LOWER EXTREMITIES  . GERD  . LEG PAIN, BILATERAL  . OSTEOPOROSIS  . TOBACCO USE, QUIT   Past Medical History  Diagnosis Date  . GERD (gastroesophageal reflux disease)   . Depression   . Arthritis   . Hyperlipidemia   . Osteoporosis   . Vitamin d deficiency    Past Surgical History  Procedure Date  . Abdominal hysterectomy   . Shoulder surgery     right   History  Substance Use Topics  . Smoking status: Former Smoker    Quit date: 01/22/2005  . Smokeless tobacco: Not on file  . Alcohol Use: No   Family History  Problem Relation Age of Onset  . Diabetes Father   . Cancer Father     lung cancer  . Cancer Sister     breast cancer  . Cancer Brother     lung cancer  . Coronary artery disease Paternal Uncle   . Cancer Maternal Grandfather  possible prostate  . Coronary artery disease Paternal Grandfather   . Heart disease Paternal Grandfather     CAD   Allergies  Allergen Reactions  . Gabapentin     REACTION: nausea  . Nsaids     REACTION: GI upset   Current Outpatient Prescriptions on File Prior to Visit  Medication Sig Dispense Refill  . Calcium-Phosphorus-Vitamin D (CALCIUM GUMMIES PO) Take 1,000 mg/kg by mouth daily.      . cholecalciferol (VITAMIN D) 1000 UNITS tablet Take 2,000 Units by mouth daily.        Marland Kitchen omeprazole (PRILOSEC) 20 MG capsule Take 20 mg by mouth 2 (two) times daily.      Marland Kitchen zolpidem (AMBIEN) 10 MG tablet Take 1 tablet (10 mg total) by mouth at bedtime as needed for sleep.  30 tablet  5  . NON FORMULARY Women's ultra mega bone density take 2 capsules  By mouth twice a day.         Review of Systems Review of Systems  Constitutional: Negative for fever, appetite change, fatigue and unexpected weight change.  Eyes: Negative for pain and visual disturbance.  Respiratory: Negative for cough and shortness of breath.   Cardiovascular: Negative for cp or palpitations    Gastrointestinal: Negative  for nausea, diarrhea and constipation.  Genitourinary: Negative for urgency and frequency.  Skin: Negative for pallor or rash   MSK pos for chronic back and leg pain from arthritis  Neurological: Negative for weakness, light-headedness, numbness and headaches.  Hematological: Negative for adenopathy. Does not bruise/bleed easily.  Psychiatric/Behavioral: Negative for dysphoric mood. The patient is not nervous/anxious.         Objective:   Physical Exam  Constitutional: She appears well-developed and well-nourished. No distress.       obese and well appearing   HENT:  Head: Normocephalic and atraumatic.  Right Ear: External ear normal.  Left Ear: External ear normal.  Nose: Nose normal.  Mouth/Throat: Oropharynx is clear and moist. No oropharyngeal exudate.  Eyes: Conjunctivae and EOM are normal. Pupils are equal, round, and reactive to light. Right eye exhibits no discharge. Left eye exhibits no discharge. No scleral icterus.  Neck: Normal range of motion. Neck supple. No JVD present. Carotid bruit is not present. No thyromegaly present.  Cardiovascular: Normal rate, regular rhythm, normal heart sounds and intact distal pulses.  Exam reveals no gallop.   Pulmonary/Chest: Effort normal and breath sounds normal. No respiratory distress. She has no wheezes.  Abdominal: Soft. Bowel sounds are normal. She exhibits no distension, no abdominal bruit and no mass. There is no tenderness.  Genitourinary: No breast swelling, tenderness, discharge or bleeding.       Breast exam: No mass, nodules, thickening, tenderness, bulging, retraction, inflamation, nipple discharge or skin changes noted.  No axillary or clavicular LA.  Chaperoned exam.    Musculoskeletal: Normal range of motion. She exhibits no edema and no tenderness.  Lymphadenopathy:    She has no cervical adenopathy.  Neurological: She is alert. She has normal reflexes. No cranial nerve deficit. She exhibits normal muscle tone.  Coordination normal.  Skin: Skin is warm and dry. No rash noted. No erythema. No pallor.       Solar lentigos diffusely   Psychiatric: She has a normal mood and affect.          Assessment & Plan:

## 2011-09-18 NOTE — Assessment & Plan Note (Signed)
D level borderline low at 31 with hx of OP  Will inc her otc to 3000 iu daily Also some outdoor time Continue to follow

## 2011-09-18 NOTE — Assessment & Plan Note (Signed)
Disc goals for lipids and reasons to control them Rev labs with pt Rev low sat fat diet in detail   

## 2011-09-18 NOTE — Assessment & Plan Note (Signed)
Thought to be due to OA in back Also has varicosities Trial of tramadol with caution on days when pain is more severe

## 2011-09-18 NOTE — Assessment & Plan Note (Signed)
IFOB today  Pt wants to schedule screening colonosc after she moves  Due to problems with sedation last time - will need GI consult before hand

## 2011-09-18 NOTE — Assessment & Plan Note (Signed)
Due for 2 y dexa Pt will call to sched that after she moves in [place] Disc ca and D Will inc D to 3000 iu daily

## 2011-09-20 ENCOUNTER — Other Ambulatory Visit: Payer: Self-pay

## 2011-09-20 NOTE — Telephone Encounter (Signed)
[facility] from [facility] request refill Tramadol; advised Tramadol and zolpidem sent electronically to [facility] in [place]. [facility] will get [facility] to [facility].

## 2011-10-16 ENCOUNTER — Other Ambulatory Visit: Payer: Medicare Other

## 2011-10-16 DIAGNOSIS — Z1211 Encounter for screening for malignant neoplasm of colon: Secondary | ICD-10-CM

## 2011-10-16 LAB — FECAL OCCULT BLOOD, IMMUNOCHEMICAL: Fecal Occult Bld: NEGATIVE

## 2012-02-14 ENCOUNTER — Other Ambulatory Visit: Payer: Self-pay

## 2012-02-14 NOTE — Telephone Encounter (Signed)
Pt left note requesting refill on Ambien. Spoke with April at [facility]; pt still has refill [facility]. April will get refilled transferred to [facility] pharmacy.pt verbalized understanding.

## 2012-03-14 ENCOUNTER — Other Ambulatory Visit: Payer: Self-pay | Admitting: Family Medicine

## 2012-03-14 MED ORDER — ZOLPIDEM TARTRATE 10 MG PO TABS: 10.0000 mg | ORAL_TABLET | Freq: Every evening | ORAL | Status: DC | PRN

## 2012-03-14 NOTE — Telephone Encounter (Signed)
Rx phoned to pharmacy.  

## 2012-03-14 NOTE — Telephone Encounter (Signed)
Received fax refill request, ok to refill  

## 2012-03-14 NOTE — Telephone Encounter (Signed)
Px written for call in   

## 2012-03-18 NOTE — Telephone Encounter (Signed)
Pt called for status of zolpidem; spoke with April and rx ready for pick up.pt notified.

## 2012-06-02 ENCOUNTER — Telehealth: Payer: Self-pay | Admitting: *Deleted

## 2012-06-02 NOTE — Telephone Encounter (Signed)
Please call pt to clarify with her -she has been on it in the past and if she wants it - 30 pills is fine, thanks

## 2012-06-02 NOTE — Telephone Encounter (Signed)
Received faxed refill request from pharmacy for Tramadol 50 mg, take one by mouth every 8 hours as needed for pain. Medication is not longer on med sheet. Is it okay to refill?

## 2012-06-03 MED ORDER — TRAMADOL HCL 50 MG PO TABS: 50.0000 mg | ORAL_TABLET | Freq: Three times a day (TID) | ORAL | Status: DC | PRN

## 2012-06-03 NOTE — Telephone Encounter (Signed)
Pt said she doesn't take it regularly but prn for back pain, pt did request refill, Rx sent to pharmacy and pt said she will f/u with Dr. Milinda Antis if the Tramadol doesn't help back pain

## 2012-09-15 ENCOUNTER — Other Ambulatory Visit: Payer: Self-pay | Admitting: *Deleted

## 2012-09-15 MED ORDER — ZOLPIDEM TARTRATE 10 MG PO TABS: 10.0000 mg | ORAL_TABLET | Freq: Every evening | ORAL | Status: DC | PRN

## 2012-09-15 NOTE — Telephone Encounter (Signed)
Last filled 08/15/12

## 2012-09-15 NOTE — Telephone Encounter (Signed)
Px written for call in   Please schedule f/u this fall, thanks

## 2012-09-16 NOTE — Telephone Encounter (Signed)
Rx called in as prescribed and f/u scheduled for 11/11/12

## 2012-09-18 ENCOUNTER — Other Ambulatory Visit: Payer: Self-pay

## 2012-09-18 ENCOUNTER — Telehealth: Payer: Self-pay

## 2012-09-18 DIAGNOSIS — M810 Age-related osteoporosis without current pathological fracture: Secondary | ICD-10-CM

## 2012-09-18 DIAGNOSIS — Z1231 Encounter for screening mammogram for malignant neoplasm of breast: Secondary | ICD-10-CM

## 2012-09-18 NOTE — Telephone Encounter (Signed)
Pt notified referral mad but maybe a few days before appt scheduled, pt is okay with this

## 2012-09-18 NOTE — Telephone Encounter (Signed)
I did the referral  Let her know we are swamped with referrals right now and Shirlee Limerick or Bonita Quin should be calling her in the next several days

## 2012-09-18 NOTE — Telephone Encounter (Signed)
Pt tried to schedule screening mammogram and bone density on same day at [facility]; bone density order needed. There is a bone density order in pts chart dated 09/18/11 which the order has expired. Pt request mammo and bone density scheduled at same time.Please advise. Pt request cb.

## 2012-10-16 ENCOUNTER — Ambulatory Visit
Admission: RE | Admit: 2012-10-16 | Discharge: 2012-10-16 | Disposition: A | Discharge status: Home / Self Care | Payer: Medicare Other | Source: Ambulatory Visit | Attending: Family Medicine | Admitting: Family Medicine

## 2012-10-16 ENCOUNTER — Ambulatory Visit
Admission: RE | Admit: 2012-10-16 | Discharge: 2012-10-16 | Disposition: A | Discharge status: Home / Self Care | Payer: Medicare Other | Source: Ambulatory Visit

## 2012-10-16 DIAGNOSIS — M810 Age-related osteoporosis without current pathological fracture: Secondary | ICD-10-CM

## 2012-10-16 DIAGNOSIS — Z1231 Encounter for screening mammogram for malignant neoplasm of breast: Secondary | ICD-10-CM

## 2012-10-17 ENCOUNTER — Encounter: Payer: Self-pay | Admitting: *Deleted

## 2012-10-27 ENCOUNTER — Encounter: Payer: Self-pay | Admitting: Family Medicine

## 2012-10-27 ENCOUNTER — Encounter: Payer: Self-pay | Admitting: *Deleted

## 2012-11-10 ENCOUNTER — Encounter: Payer: Self-pay | Admitting: Radiology

## 2012-11-11 ENCOUNTER — Encounter: Payer: Self-pay | Admitting: Family Medicine

## 2012-11-11 ENCOUNTER — Ambulatory Visit (INDEPENDENT_AMBULATORY_CARE_PROVIDER_SITE_OTHER): Payer: Medicare Other | Admitting: Family Medicine

## 2012-11-11 VITALS — BP 136/82 | HR 80 | Temp 97.7°F | Ht 66.0 in | Wt 196.5 lb

## 2012-11-11 DIAGNOSIS — G4700 Insomnia, unspecified: Secondary | ICD-10-CM

## 2012-11-11 DIAGNOSIS — K219 Gastro-esophageal reflux disease without esophagitis: Secondary | ICD-10-CM

## 2012-11-11 DIAGNOSIS — E785 Hyperlipidemia, unspecified: Secondary | ICD-10-CM

## 2012-11-11 DIAGNOSIS — Z23 Encounter for immunization: Secondary | ICD-10-CM

## 2012-11-11 DIAGNOSIS — M810 Age-related osteoporosis without current pathological fracture: Secondary | ICD-10-CM

## 2012-11-11 MED ORDER — TRAMADOL HCL 50 MG PO TABS: 50.0000 mg | ORAL_TABLET | Freq: Three times a day (TID) | ORAL | Status: DC | PRN

## 2012-11-11 NOTE — Progress Notes (Signed)
Subjective:    Patient ID: [name] [name], female    DOB: [birth year], 67 y.o.   MRN: [id number]  HPI Here for f/u of chronic medical problems   Wt is up 1 lb with bmi of 31  Flu shot-declines in past - got one today   Insomnia- takes [place]- it works fine - and she does take it every night  No falls or accidents   Hx of hyperlipidemia Lab Results  Component Value Date   CHOL 210* 09/14/2011   HDL 68.30 09/14/2011   LDLDIRECT 123.3 09/14/2011   TRIG 82.0 09/14/2011   CHOLHDL 3 09/14/2011   good ratio  Tramadol- takes it occasionally when her back hurts - needs another px today  Last dose was 2 weeks or more   Needs controlled sub screen today  GERD- has not needed her PPI - as long as she eats the right things   OP= had dexa last mo 9/14  Still op but slt imp in the spine  She has not had a broken bone outside of childhood  Ca and D- taking that  D def in the past  Exercise - tries to walk some / she has trouble with legs and feet   Zoster status - has not had shingles or the vaccine  Her insurance does cover at an office   Last colonosc 08- she had a bad experience with it and does not want to repeat it  Has done ifob card   Mood has been good   Patient Active Problem List   Diagnosis Date Noted  . Colon cancer screening 09/18/2011  . UNSPECIFIED VITAMIN D DEFICIENCY 02/16/2009  . INSOMNIA, CHRONIC 12/14/2008  . VARICOSE VEINS, LOWER EXTREMITIES 12/14/2008  . LEG PAIN, BILATERAL 12/14/2008  . HYPERLIPIDEMIA 03/11/2007  . OSTEOPOROSIS 03/11/2007  . TOBACCO USE, QUIT 03/11/2007  . OTHER CHRONIC PAIN 02/25/2007  . GERD 02/25/2007  . DEPRESSION 01/22/1997   Past Medical History  Diagnosis Date  . GERD (gastroesophageal reflux disease)   . Depression   . Arthritis   . Hyperlipidemia   . Osteoporosis   . Vitamin D deficiency    Past Surgical History  Procedure Laterality Date  . Abdominal hysterectomy    . Shoulder surgery      right   History   Substance Use Topics  . Smoking status: Former Smoker    Quit date: 01/22/2005  . Smokeless tobacco: Not on file  . Alcohol Use: No   Family History  Problem Relation Age of Onset  . Diabetes Father   . Cancer Father     lung cancer  . Cancer Sister     breast cancer  . Cancer Brother     lung cancer  . Coronary artery disease Paternal Uncle   . Cancer Maternal Grandfather     possible prostate  . Coronary artery disease Paternal Grandfather   . Heart disease Paternal Grandfather     CAD   Allergies  Allergen Reactions  . Gabapentin     REACTION: nausea  . Nsaids     REACTION: GI upset   Current Outpatient Prescriptions on File Prior to Visit  Medication Sig Dispense Refill  . cholecalciferol (VITAMIN D) 1000 UNITS tablet Take 3,000 Units by mouth daily.       . traMADol (ULTRAM) 50 MG tablet Take 1 tablet (50 mg total) by mouth every 8 (eight) hours as needed for pain.  30 tablet  0  . zolpidem (AMBIEN) 10  MG tablet Take 1 tablet (10 mg total) by mouth at bedtime as needed for sleep.  30 tablet  2   No current facility-administered medications on file prior to visit.       Review of Systems Review of Systems  Constitutional: Negative for fever, appetite change, fatigue and unexpected weight change. pos for insomnia  Eyes: Negative for pain and visual disturbance.  Respiratory: Negative for cough and shortness of breath.   Cardiovascular: Negative for cp or palpitations    Gastrointestinal: Negative for nausea, diarrhea and constipation.  Genitourinary: Negative for urgency and frequency.  Skin: Negative for pallor or rash   MSK pos for occ back pain  Neurological: Negative for weakness, light-headedness, numbness and headaches.  Hematological: Negative for adenopathy. Does not bruise/bleed easily.  Psychiatric/Behavioral: Negative for dysphoric mood. The patient is not nervous/anxious.         Objective:   Physical Exam  Constitutional: She appears  well-developed and well-nourished. No distress.  overwt and well appearing  HENT:  Head: Normocephalic and atraumatic.  Right Ear: External ear normal.  Left Ear: External ear normal.  Mouth/Throat: Oropharynx is clear and moist.  Eyes: Conjunctivae and EOM are normal. Pupils are equal, round, and reactive to light. No scleral icterus.  Neck: Normal range of motion. Neck supple. No JVD present. Carotid bruit is not present. No thyromegaly present.  Cardiovascular: Normal rate, regular rhythm, normal heart sounds and intact distal pulses.  Exam reveals no gallop.   Pulmonary/Chest: Effort normal and breath sounds normal. No respiratory distress. She has no wheezes. She exhibits no tenderness.  Abdominal: Soft. Bowel sounds are normal. She exhibits no distension, no abdominal bruit and no mass. There is no tenderness.  Genitourinary: No breast swelling, tenderness, discharge or bleeding.  Musculoskeletal: Normal range of motion. She exhibits no edema and no tenderness.  Lymphadenopathy:    She has no cervical adenopathy.  Neurological: She is alert. She has normal reflexes. No cranial nerve deficit. She exhibits normal muscle tone. Coordination normal.  Skin: Skin is warm and dry. No rash noted. No erythema. No pallor.  Psychiatric: She has a normal mood and affect.          Assessment & Plan:

## 2012-11-11 NOTE — Patient Instructions (Addendum)
Schedule a nurse appointment in about 1 month for shingles vaccine I'm glad you had your flu shot today  Labs for vitamin D today  evista is another option for osteoporosis treatment (fracture prevention)- if you are interested in it let me know

## 2012-11-12 ENCOUNTER — Encounter: Payer: Self-pay | Admitting: *Deleted

## 2012-11-12 NOTE — Assessment & Plan Note (Signed)
Much imp with anti reflux measures/ diet  No longer on PPI This is encouraging

## 2012-11-12 NOTE — Assessment & Plan Note (Signed)
Continues nightly ambien Stressed imp of safety and fall prevention  Disc sleep hygiene  This is the only measure that has worked

## 2012-11-12 NOTE — Assessment & Plan Note (Signed)
Rev last lipids Rev low sat fat diet

## 2012-11-12 NOTE — Assessment & Plan Note (Addendum)
Rev dexa No falls or fx  Disc tx options- in past intol of fosamax (gi) Suggested evista- given handout on it to consider/disc poss side eff She will look it over and call back if she wants to start it  Disc ca/ D and exercise, safety/ fall prev D level today

## 2012-12-02 ENCOUNTER — Encounter: Payer: Self-pay | Admitting: Family Medicine

## 2012-12-17 ENCOUNTER — Other Ambulatory Visit: Payer: Self-pay | Admitting: *Deleted

## 2012-12-17 MED ORDER — ZOLPIDEM TARTRATE 10 MG PO TABS: 10.0000 mg | ORAL_TABLET | Freq: Every evening | ORAL | Status: DC | PRN

## 2012-12-17 NOTE — Telephone Encounter (Signed)
Last office visit 11/11/2012.  Ok to refill?

## 2012-12-17 NOTE — Telephone Encounter (Signed)
Px written for call in   

## 2012-12-17 NOTE — Telephone Encounter (Signed)
Called to [facility]. 

## 2012-12-23 ENCOUNTER — Ambulatory Visit: Payer: Medicare Other

## 2012-12-30 ENCOUNTER — Ambulatory Visit (INDEPENDENT_AMBULATORY_CARE_PROVIDER_SITE_OTHER): Payer: Medicare Other

## 2012-12-30 DIAGNOSIS — Z23 Encounter for immunization: Secondary | ICD-10-CM

## 2012-12-30 DIAGNOSIS — Z2911 Encounter for prophylactic immunotherapy for respiratory syncytial virus (RSV): Secondary | ICD-10-CM

## 2013-03-23 ENCOUNTER — Other Ambulatory Visit: Payer: Self-pay | Admitting: *Deleted

## 2013-03-23 MED ORDER — ZOLPIDEM TARTRATE 10 MG PO TABS: 10.0000 mg | ORAL_TABLET | Freq: Every evening | ORAL | Status: DC | PRN

## 2013-03-23 NOTE — Telephone Encounter (Signed)
Rx called in as prescribed 

## 2013-03-23 NOTE — Telephone Encounter (Signed)
Ok to refill 

## 2013-03-23 NOTE — Telephone Encounter (Signed)
Px written for call in   

## 2013-06-23 ENCOUNTER — Other Ambulatory Visit: Payer: Self-pay | Admitting: *Deleted

## 2013-06-23 MED ORDER — ZOLPIDEM TARTRATE 10 MG PO TABS: 10.0000 mg | ORAL_TABLET | Freq: Every evening | ORAL | Status: DC | PRN

## 2013-06-23 NOTE — Telephone Encounter (Signed)
Rx called in as prescribed 

## 2013-06-23 NOTE — Telephone Encounter (Signed)
Px written for call in   She is due for f/u or PE in oct or Nov

## 2013-06-23 NOTE — Telephone Encounter (Signed)
Fax refill request, please advise  

## 2013-11-11 ENCOUNTER — Telehealth: Payer: Self-pay | Admitting: Family Medicine

## 2013-11-11 ENCOUNTER — Other Ambulatory Visit (INDEPENDENT_AMBULATORY_CARE_PROVIDER_SITE_OTHER): Payer: Medicare Other

## 2013-11-11 DIAGNOSIS — M810 Age-related osteoporosis without current pathological fracture: Secondary | ICD-10-CM

## 2013-11-11 DIAGNOSIS — Z0000 Encounter for general adult medical examination without abnormal findings: Secondary | ICD-10-CM

## 2013-11-11 DIAGNOSIS — E559 Vitamin D deficiency, unspecified: Secondary | ICD-10-CM

## 2013-11-11 DIAGNOSIS — E785 Hyperlipidemia, unspecified: Secondary | ICD-10-CM

## 2013-11-11 LAB — CBC WITH DIFFERENTIAL/PLATELET
BASOS ABS: 0.1 10*3/uL (ref 0.0–0.1)
Basophils Relative: 0.8 % (ref 0.0–3.0)
EOS ABS: 0.2 10*3/uL (ref 0.0–0.7)
Eosinophils Relative: 3.2 % (ref 0.0–5.0)
HCT: 40.1 % (ref 36.0–46.0)
Hemoglobin: 13 g/dL (ref 12.0–15.0)
LYMPHS ABS: 1.9 10*3/uL (ref 0.7–4.0)
Lymphocytes Relative: 30.7 % (ref 12.0–46.0)
MCHC: 32.5 g/dL (ref 30.0–36.0)
MCV: 92 fl (ref 78.0–100.0)
MONO ABS: 0.6 10*3/uL (ref 0.1–1.0)
MONOS PCT: 10.5 % (ref 3.0–12.0)
Neutro Abs: 3.4 10*3/uL (ref 1.4–7.7)
Neutrophils Relative %: 54.8 % (ref 43.0–77.0)
PLATELETS: 379 10*3/uL (ref 150.0–400.0)
RBC: 4.36 Mil/uL (ref 3.87–5.11)
RDW: 13.9 % (ref 11.5–15.5)
WBC: 6.2 10*3/uL (ref 4.0–10.5)

## 2013-11-11 LAB — VITAMIN D 25 HYDROXY (VIT D DEFICIENCY, FRACTURES): VITD: 24.04 ng/mL — ABNORMAL LOW (ref 30.00–100.00)

## 2013-11-11 NOTE — Telephone Encounter (Signed)
Message copied by Abner Greenspan on Wed Nov 11, 2013  7:29 AM ------      Message from: [name]      Created: Fri Nov 06, 2013  4:14 PM      Regarding: Lab orders for Wednesday, 10.21.15       Patient is scheduled for CPX labs, please order future labs, Thanks , Terri       ------

## 2013-11-12 LAB — COMPREHENSIVE METABOLIC PANEL
ALK PHOS: 71 U/L (ref 39–117)
ALT: 18 U/L (ref 0–35)
AST: 23 U/L (ref 0–37)
Albumin: 3.4 g/dL — ABNORMAL LOW (ref 3.5–5.2)
BILIRUBIN TOTAL: 0.7 mg/dL (ref 0.2–1.2)
BUN: 11 mg/dL (ref 6–23)
CO2: 21 meq/L (ref 19–32)
Calcium: 9.2 mg/dL (ref 8.4–10.5)
Chloride: 105 mEq/L (ref 96–112)
Creatinine, Ser: 0.9 mg/dL (ref 0.4–1.2)
GFR: 65.47 mL/min (ref >60.00)
Glucose, Bld: 96 mg/dL (ref 70–99)
Potassium: 4.3 mEq/L (ref 3.5–5.1)
SODIUM: 140 meq/L (ref 135–145)
Total Protein: 7.4 g/dL (ref 6.0–8.3)

## 2013-11-12 LAB — LIPID PANEL
CHOL/HDL RATIO: 3
Cholesterol: 192 mg/dL (ref 0–200)
HDL: 64.3 mg/dL (ref >39.00)
LDL Cholesterol: 105 mg/dL — ABNORMAL HIGH (ref 0–99)
NONHDL: 127.7
Triglycerides: 112 mg/dL (ref 0.0–149.0)
VLDL: 22.4 mg/dL (ref 0.0–40.0)

## 2013-11-12 LAB — TSH: TSH: 1.7 u[IU]/mL (ref 0.35–4.50)

## 2013-11-17 ENCOUNTER — Telehealth: Payer: Self-pay | Admitting: Family Medicine

## 2013-11-17 NOTE — Telephone Encounter (Signed)
No - we will just go with what we already have and only draw blood the day of the appt if needed

## 2013-11-17 NOTE — Telephone Encounter (Signed)
Patient had an appointment for a physical tomorrow and had her lab work done last week.  Patient said she's not feeling well and rescheduled her appointment for her physical to your next available 03/30/14.  Patient wants to know if she'll need to have her lab work done again in March?

## 2013-11-17 NOTE — Telephone Encounter (Signed)
Pt.notified

## 2013-11-18 ENCOUNTER — Encounter: Payer: Medicare Other | Admitting: Family Medicine

## 2013-12-07 ENCOUNTER — Telehealth: Payer: Self-pay

## 2013-12-07 NOTE — Telephone Encounter (Signed)
Called pt to reschedule AWV for 2015. Pt agreed to seeing Webb Silversmith and was able to find a slot on Regina's schedule.  Colonoscopy Record is the only item that may not be addressed during visit.

## 2013-12-14 ENCOUNTER — Encounter: Payer: Self-pay | Admitting: Internal Medicine

## 2013-12-14 ENCOUNTER — Ambulatory Visit (INDEPENDENT_AMBULATORY_CARE_PROVIDER_SITE_OTHER): Payer: Medicare Other | Admitting: Internal Medicine

## 2013-12-14 VITALS — BP 126/78 | HR 76 | Temp 98.0°F | Ht 65.5 in | Wt 198.0 lb

## 2013-12-14 DIAGNOSIS — Z0000 Encounter for general adult medical examination without abnormal findings: Secondary | ICD-10-CM

## 2013-12-14 MED ORDER — TRAMADOL HCL 50 MG PO TABS: 50.0000 mg | ORAL_TABLET | Freq: Three times a day (TID) | ORAL | Status: DC | PRN

## 2013-12-14 NOTE — Patient Instructions (Signed)

## 2013-12-14 NOTE — Progress Notes (Signed)
HPI:  Pt presents to the clinic today for her annual medicare wellness visit.  Past Medical History  Diagnosis Date  . GERD (gastroesophageal reflux disease)   . Depression   . Arthritis   . Hyperlipidemia   . Osteoporosis   . Vitamin D deficiency     Current Outpatient Prescriptions  Medication Sig Dispense Refill  . Calcium Carb-Cholecalciferol (CALCIUM 600 + D PO) Take 2 tablets by mouth daily.    . cholecalciferol (VITAMIN D) 1000 UNITS tablet Take 3,000 Units by mouth daily.     . Probiotic Product (PRO-BIOTIC BLEND) CAPS Take 1 capsule by mouth daily.    . traMADol (ULTRAM) 50 MG tablet Take 1 tablet (50 mg total) by mouth every 8 (eight) hours as needed for pain. 30 tablet 3  . zolpidem (AMBIEN) 10 MG tablet Take 1 tablet (10 mg total) by mouth at bedtime as needed for sleep. 30 tablet 5  . tretinoin (RETIN-A) 0.05 % cream   5   No current facility-administered medications for this visit.    Allergies  Allergen Reactions  . Fosamax [Alendronate Sodium]     GI intolerance   . Gabapentin     REACTION: nausea  . Nsaids     REACTION: GI upset    Family History  Problem Relation Age of Onset  . Diabetes Father   . Cancer Father     lung cancer  . Cancer Sister     breast cancer  . Cancer Brother     lung cancer  . Coronary artery disease Paternal Uncle   . Cancer Maternal Grandfather     possible prostate  . Coronary artery disease Paternal Grandfather   . Heart disease Paternal Grandfather     CAD    History   Social History  . Marital Status: Married    Spouse Name: N/A    Number of Children: N/A  . Years of Education: N/A   Occupational History  . Not on file.   Social History Main Topics  . Smoking status: Former Smoker    Quit date: 01/22/2005  . Smokeless tobacco: Not on file  . Alcohol Use: No  . Drug Use: No  . Sexual Activity: Not on file   Other Topics Concern  . Not on file   Social History Narrative    Hospitiliaztions: None  in the last 6 months  Health Maintenance:    Flu: last one 2013- bad reaction, has not taken since  Tetanus: 2008  Pneumovax: 2009 (prevnar)  Zostavax: 2014  Mammogram: 09/2012 normal  Pap Smear: no longer screening  Bone Density: 09/2012- osteoporosis  Colonoscopy: 2008- no longer screening  Eye Doctor: 2013  Dental Exam: yearly  Providers:   PCP: Dr. Glori Bickers  Eye doctor: [facility]- cannot remember  doctor's name  I have personally reviewed and have noted:  1. The patient's medical and social history 2. Their use of alcohol, tobacco or illicit drugs 3. Their current medications and supplements 4. The patient's functional ability including ADL's, fall  risks, home safety risks and  hearing or visual  impairment. 5. Diet and physical activities 6. Evidence for depression or mood disorder  Subjective:   Review of Systems:   Constitutional: Denies fever, malaise, fatigue, headache or abrupt weight changes.  HEENT: Denies eye pain, eye redness, ear pain, ringing in the ears, wax buildup, runny nose, nasal congestion, bloody nose, or sore throat. Respiratory: Denies difficulty breathing, shortness of breath, cough or sputum production.  Cardiovascular: Denies chest pain, chest tightness, palpitations or swelling in the hands or feet.  Gastrointestinal: Denies abdominal pain, bloating, constipation, diarrhea or blood in the stool.  GU: Denies urgency, frequency, pain with urination, burning sensation, blood in urine, odor or discharge. Musculoskeletal: Pt reports joint pain. Denies decrease in range of motion, difficulty with gait, muscle pain or joint swelling.  Skin: Denies redness, rashes, lesions or ulcercations.  Neurological: Denies dizziness, difficulty with memory, difficulty with speech or problems with balance and coordination.   No other specific complaints in a complete review of systems (except as listed in HPI above).  Objective:  PE:   BP 126/78 mmHg   Pulse 76  Temp(Src) 98 F (36.7 C) (Oral)  Ht 5' 5.5" (1.664 m)  Wt 198 lb (89.812 kg)  BMI 32.44 kg/m2  SpO2 97% Wt Readings from Last 3 Encounters:  12/14/13 198 lb (89.812 kg)  11/11/12 196 lb 8 oz (89.132 kg)  09/18/11 195 lb 12 oz (88.792 kg)    General: Appears her stated age, well developed, well nourished in NAD. Cardiovascular: Normal rate and rhythm. S1,S2 noted.  No murmur, rubs or gallops noted. No JVD or BLE edema. No carotid bruits noted. Pulmonary/Chest: Normal effort and positive vesicular breath sounds. No respiratory distress. No wheezes, rales or ronchi noted.  Neurological: Alert and oriented. Marland Kitchen Psychiatric: Mood and affect normal.  BMET    Component Value Date/Time   NA 140 11/11/2013 0826   K 4.3 11/11/2013 0826   CL 105 11/11/2013 0826   CO2 21 11/11/2013 0826   GLUCOSE 96 11/11/2013 0826   BUN 11 11/11/2013 0826   CREATININE 0.9 11/11/2013 0826   CALCIUM 9.2 11/11/2013 0826   GFRNONAA 90.00 12/14/2008 0954   GFRAA 94 03/11/2007 0951    Lipid Panel     Component Value Date/Time   CHOL 192 11/11/2013 0826   TRIG 112.0 11/11/2013 0826   HDL 64.30 11/11/2013 0826   CHOLHDL 3 11/11/2013 0826   VLDL 22.4 11/11/2013 0826   LDLCALC 105* 11/11/2013 0826    CBC    Component Value Date/Time   WBC 6.2 11/11/2013 0826   RBC 4.36 11/11/2013 0826   HGB 13.0 11/11/2013 0826   HCT 40.1 11/11/2013 0826   PLT 379.0 11/11/2013 0826   MCV 92.0 11/11/2013 0826   MCHC 32.5 11/11/2013 0826   RDW 13.9 11/11/2013 0826   LYMPHSABS 1.9 11/11/2013 0826   MONOABS 0.6 11/11/2013 0826   EOSABS 0.2 11/11/2013 0826   BASOSABS 0.1 11/11/2013 0826    Hgb A1C No results found for: HGBA1C    Assessment and Plan:   Medicare Annual Wellness Visit:  Diet: Does not adhere to any specific diet Physical activity: Sedentary Depression/mood screen: Negative Hearing: Intact to whispered voice Visual acuity: Grossly normal ADLs: Capable Fall risk: None Home  safety: Good Cognitive evaluation: Intact to orientation, naming, recall and repetition EOL planning: No advance directive or living will/full code/ I agree  Preventative Medicine:  Reviewed labs- Vit D slightly low with known osteoporosis. She refuses medication to treat. Taking 5000 units Vit D OTC  Next appointment:  1 year with Dr. Glori Bickers

## 2013-12-14 NOTE — Progress Notes (Signed)
Pre visit review using our clinic review tool, if applicable. No additional management support is needed unless otherwise documented below in the visit note. 

## 2014-01-06 ENCOUNTER — Telehealth: Payer: Self-pay | Admitting: *Deleted

## 2014-01-06 MED ORDER — ZOLPIDEM TARTRATE 10 MG PO TABS: 10.0000 mg | ORAL_TABLET | Freq: Every evening | ORAL | Status: DC | PRN

## 2014-01-06 NOTE — Telephone Encounter (Signed)
Fax refill request, please advise  

## 2014-01-06 NOTE — Telephone Encounter (Signed)
Pt had cpe w/ R. Baity with labs 12/14/13  Please refill and close

## 2014-01-06 NOTE — Telephone Encounter (Signed)
I have not seen her since 10/14  Please make her a f/u when able Px written for call in

## 2014-01-06 NOTE — Telephone Encounter (Signed)
See prev note. I will call in Rx, please schedule f/u per Dr. Glori Bickers

## 2014-01-06 NOTE — Telephone Encounter (Signed)
Rx called in as prescribed 

## 2014-03-09 ENCOUNTER — Other Ambulatory Visit: Payer: Self-pay | Admitting: *Deleted

## 2014-03-09 MED ORDER — ZOLPIDEM TARTRATE 10 MG PO TABS: 10.0000 mg | ORAL_TABLET | Freq: Every evening | ORAL | Status: DC | PRN

## 2014-03-09 NOTE — Telephone Encounter (Signed)
Ok to refill. Received faxed from [place] for medication refill of Ambien 10 mg. Patient was last seen on 12/14/13. CPE is schedule for 12/28/14.

## 2014-03-09 NOTE — Telephone Encounter (Signed)
Called in rx to [facility]

## 2014-03-09 NOTE — Telephone Encounter (Signed)
Px written for call in   

## 2014-03-30 ENCOUNTER — Encounter: Payer: Medicare Other | Admitting: Family Medicine

## 2014-04-20 ENCOUNTER — Telehealth: Payer: Self-pay

## 2014-04-20 NOTE — Telephone Encounter (Signed)
Left message for pt to call back if she still wants flu vaccine 

## 2014-07-12 ENCOUNTER — Other Ambulatory Visit: Payer: Self-pay | Admitting: *Deleted

## 2014-07-12 MED ORDER — ZOLPIDEM TARTRATE 10 MG PO TABS: 10.0000 mg | ORAL_TABLET | Freq: Every evening | ORAL | Status: DC | PRN

## 2014-07-12 NOTE — Telephone Encounter (Signed)
Electronic refill request, pt has CPE scheduled for 12/28/14, last refilled 03/09/14 #30 with 3 additional refills, please advise

## 2014-07-12 NOTE — Telephone Encounter (Signed)
Px written for call in   

## 2014-07-13 NOTE — Telephone Encounter (Signed)
Rx called in as prescribed 

## 2014-11-16 ENCOUNTER — Other Ambulatory Visit: Payer: Self-pay | Admitting: Family Medicine

## 2014-11-16 MED ORDER — ZOLPIDEM TARTRATE 10 MG PO TABS: 10.0000 mg | ORAL_TABLET | Freq: Every evening | ORAL | Status: DC | PRN

## 2014-11-16 NOTE — Telephone Encounter (Signed)
Rx called in as prescribed 

## 2014-11-16 NOTE — Telephone Encounter (Signed)
Fax refill request, pt has CPE scheduled on 12/28/14, last refilled on 07/12/14 #30 with 3 additional refills, please advise

## 2014-11-16 NOTE — Telephone Encounter (Signed)
Px written for call in   

## 2014-12-23 ENCOUNTER — Telehealth: Payer: Self-pay | Admitting: Family Medicine

## 2014-12-23 DIAGNOSIS — E559 Vitamin D deficiency, unspecified: Secondary | ICD-10-CM

## 2014-12-23 DIAGNOSIS — E785 Hyperlipidemia, unspecified: Secondary | ICD-10-CM

## 2014-12-23 NOTE — Telephone Encounter (Signed)
I can order vit D and cholesterol labs - cannot order more than that until I know what insurance that she has and that is not clear in the chart  If that gets clarified I may be able to order more

## 2014-12-23 NOTE — Telephone Encounter (Signed)
-----   Message from [name] sent at 12/23/2014  9:47 AM EST ----- Regarding: Lab orders for Friday, 12.2.16 Patient is scheduled for CPX labs, please order future labs, Thanks , Karna Christmas

## 2014-12-24 ENCOUNTER — Other Ambulatory Visit (INDEPENDENT_AMBULATORY_CARE_PROVIDER_SITE_OTHER): Payer: Medicare Other

## 2014-12-24 DIAGNOSIS — E559 Vitamin D deficiency, unspecified: Secondary | ICD-10-CM | POA: Diagnosis not present

## 2014-12-24 DIAGNOSIS — E785 Hyperlipidemia, unspecified: Secondary | ICD-10-CM | POA: Diagnosis not present

## 2014-12-24 LAB — LIPID PANEL
CHOLESTEROL: 190 mg/dL (ref 0–200)
HDL: 70.6 mg/dL (ref >39.00)
LDL Cholesterol: 107 mg/dL — ABNORMAL HIGH (ref 0–99)
NonHDL: 119.19
Total CHOL/HDL Ratio: 3
Triglycerides: 63 mg/dL (ref 0.0–149.0)
VLDL: 12.6 mg/dL (ref 0.0–40.0)

## 2014-12-24 LAB — COMPREHENSIVE METABOLIC PANEL
ALBUMIN: 4.2 g/dL (ref 3.5–5.2)
ALK PHOS: 74 U/L (ref 39–117)
ALT: 14 U/L (ref 0–35)
AST: 16 U/L (ref 0–37)
BUN: 15 mg/dL (ref 6–23)
CALCIUM: 9.7 mg/dL (ref 8.4–10.5)
CHLORIDE: 105 meq/L (ref 96–112)
CO2: 28 mEq/L (ref 19–32)
Creatinine, Ser: 0.8 mg/dL (ref 0.40–1.20)
GFR: 75.71 mL/min (ref >60.00)
Glucose, Bld: 97 mg/dL (ref 70–99)
Potassium: 4.4 mEq/L (ref 3.5–5.1)
Sodium: 141 mEq/L (ref 135–145)
TOTAL PROTEIN: 7.5 g/dL (ref 6.0–8.3)
Total Bilirubin: 0.6 mg/dL (ref 0.2–1.2)

## 2014-12-24 LAB — VITAMIN D 25 HYDROXY (VIT D DEFICIENCY, FRACTURES): VITD: 26.88 ng/mL — ABNORMAL LOW (ref 30.00–100.00)

## 2014-12-28 ENCOUNTER — Ambulatory Visit (INDEPENDENT_AMBULATORY_CARE_PROVIDER_SITE_OTHER): Payer: Medicare Other | Admitting: Family Medicine

## 2014-12-28 ENCOUNTER — Encounter: Payer: Self-pay | Admitting: Family Medicine

## 2014-12-28 VITALS — BP 124/76 | HR 65 | Temp 97.8°F | Ht 65.75 in | Wt 185.8 lb

## 2014-12-28 DIAGNOSIS — Z0000 Encounter for general adult medical examination without abnormal findings: Secondary | ICD-10-CM

## 2014-12-28 DIAGNOSIS — E559 Vitamin D deficiency, unspecified: Secondary | ICD-10-CM

## 2014-12-28 DIAGNOSIS — E785 Hyperlipidemia, unspecified: Secondary | ICD-10-CM | POA: Diagnosis not present

## 2014-12-28 DIAGNOSIS — Z23 Encounter for immunization: Secondary | ICD-10-CM | POA: Diagnosis not present

## 2014-12-28 DIAGNOSIS — Z7189 Other specified counseling: Secondary | ICD-10-CM | POA: Insufficient documentation

## 2014-12-28 DIAGNOSIS — M810 Age-related osteoporosis without current pathological fracture: Secondary | ICD-10-CM | POA: Diagnosis not present

## 2014-12-28 DIAGNOSIS — Z1211 Encounter for screening for malignant neoplasm of colon: Secondary | ICD-10-CM

## 2014-12-28 MED ORDER — RALOXIFENE HCL 60 MG PO TABS: 60.0000 mg | ORAL_TABLET | Freq: Every day | ORAL | Status: DC

## 2014-12-28 MED ORDER — TRAMADOL HCL 50 MG PO TABS: 50.0000 mg | ORAL_TABLET | Freq: Three times a day (TID) | ORAL | Status: DC | PRN

## 2014-12-28 NOTE — Progress Notes (Signed)
Pre visit review using our clinic review tool, if applicable. No additional management support is needed unless otherwise documented below in the visit note. 

## 2014-12-28 NOTE — Patient Instructions (Addendum)
Don't forget to schedule your mammogram  Do stool kit for screening  Flu and prevnar vaccines today  Try magnesium - 250 to 500 mg daily to help with leg cramps daily   I will evista px - check on price at different pharmacies/this is to prevent broken bones  Do work on the advance directive (blue booklet)  Take care of yourself  

## 2014-12-28 NOTE — Progress Notes (Signed)
Subjective:    Patient ID: [name] [name], female    DOB: [birth year], 69 y.o.   MRN: [id number]  HPI Here for annual medicare wellness visit as well as chronic/acute medical problems and also routine preventative exam  I have personally reviewed the Medicare Annual Wellness questionnaire and have noted 1. The patient's medical and social history 2. Their use of alcohol, tobacco or illicit drugs 3. Their current medications and supplements 4. The patient's functional ability including ADL's, fall risks, home safety risks and hearing or visual             impairment. 5. Diet and physical activities 6. Evidence for depression or mood disorders  The patients weight, height, BMI have been recorded in the chart and visual acuity is per eye clinic.  I have made referrals, counseling and provided education to the patient based review of the above and I have provided the pt with a written personalized care plan for preventive services. Reviewed and updated provider list, see scanned forms.  See scanned forms.  Routine anticipatory guidance given to patient.  See health maintenance. Colon cancer screening 2/08 - hyperplastic polyps (had a tough time with it- will need anethesiologist next time) , IFOB 2013 nl  Breast cancer screening 9/14 due - will schedule that in this spring (sister and aunt with bca) Self breast exam-no lumps  Flu vaccine- will get today  Tetanus vaccine - 1/08  Pneumovax 2/09 , will get prevnar today  Zoster vaccine 12/14 dexa- had 9/14 - OP , could not take fosamax , looked in to evista but worried about leg cramps frequently - may be interested in it , wants to wait on the dexa (D is low- just started 5000 iu since getting that result)  Hep C screening  - does not think she is high risk - she declines  Advance directive - has a POA but not a living will /has been discussing it  Cognitive function addressed- see scanned forms- and if abnormal then additional  documentation follows. -no major concerns   PMH and SH reviewed  Meds, vitals, and allergies reviewed.   ROS: See HPI.  Otherwise negative.    Has full time care of 2 grand kids - age 33 and 29  Caring for 23 yo mother  Stays very active   Wt is down 13 lb - she thinks from being very busy and walking  Also eating more healthy   Results for orders placed or performed in visit on 12/24/14  VITAMIN D 25 Hydroxy (Vit-D Deficiency, Fractures)  Result Value Ref Range   VITD 26.88 (L) 30.00 - 100.00 ng/mL  Comprehensive metabolic panel  Result Value Ref Range   Sodium 141 135 - 145 mEq/L   Potassium 4.4 3.5 - 5.1 mEq/L   Chloride 105 96 - 112 mEq/L   CO2 28 19 - 32 mEq/L   Glucose, Bld 97 70 - 99 mg/dL   BUN 15 6 - 23 mg/dL   Creatinine, Ser 0.80 0.40 - 1.20 mg/dL   Total Bilirubin 0.6 0.2 - 1.2 mg/dL   Alkaline Phosphatase 74 39 - 117 U/L   AST 16 0 - 37 U/L   ALT 14 0 - 35 U/L   Total Protein 7.5 6.0 - 8.3 g/dL   Albumin 4.2 3.5 - 5.2 g/dL   Calcium 9.7 8.4 - 10.5 mg/dL   GFR 75.71 >60.00 mL/min  Lipid panel  Result Value Ref Range   Cholesterol 190 0 -  200 mg/dL   Triglycerides 63.0 0.0 - 149.0 mg/dL   HDL 70.60 >39.00 mg/dL   VLDL 12.6 0.0 - 40.0 mg/dL   LDL Cholesterol 107 (H) 0 - 99 mg/dL   Total CHOL/HDL Ratio 3    NonHDL 119.19     Cholesterol is stable /well controlled with high HDL    Patient Active Problem List   Diagnosis Date Noted  . Routine general medical examination at a health care facility 12/28/2014  . Encounter for Medicare annual wellness exam 11/11/2013  . Colon cancer screening 09/18/2011  . Vitamin D deficiency 02/16/2009  . INSOMNIA, CHRONIC 12/14/2008  . VARICOSE VEINS, LOWER EXTREMITIES 12/14/2008  . LEG PAIN, BILATERAL 12/14/2008  . Hyperlipidemia 03/11/2007  . Osteoporosis 03/11/2007  . TOBACCO USE, QUIT 03/11/2007  . OTHER CHRONIC PAIN 02/25/2007  . GERD 02/25/2007  . DEPRESSION 01/22/1997   Past Medical History  Diagnosis Date   . GERD (gastroesophageal reflux disease)   . Depression   . Arthritis   . Hyperlipidemia   . Osteoporosis   . Vitamin D deficiency    Past Surgical History  Procedure Laterality Date  . Abdominal hysterectomy    . Shoulder surgery      right   Social History  Substance Use Topics  . Smoking status: Former Smoker    Quit date: 01/22/2005  . Smokeless tobacco: None  . Alcohol Use: No   Family History  Problem Relation Age of Onset  . Diabetes Father   . Cancer Father     lung cancer  . Cancer Sister     breast cancer  . Cancer Brother     lung cancer  . Coronary artery disease Paternal Uncle   . Cancer Maternal Grandfather     possible prostate  . Coronary artery disease Paternal Grandfather   . Heart disease Paternal Grandfather     CAD   Allergies  Allergen Reactions  . Fosamax [Alendronate Sodium]     GI intolerance   . Gabapentin     REACTION: nausea  . Nsaids     REACTION: GI upset   Current Outpatient Prescriptions on File Prior to Visit  Medication Sig Dispense Refill  . cholecalciferol (VITAMIN D) 1000 UNITS tablet Take 3,000 Units by mouth daily.     Marland Kitchen tretinoin (RETIN-A) 0.05 % cream   5  . zolpidem (AMBIEN) 10 MG tablet Take 1 tablet (10 mg total) by mouth at bedtime as needed for sleep. 30 tablet 3  . Calcium Carb-Cholecalciferol (CALCIUM 600 + D PO) Take 2 tablets by mouth daily.    . Probiotic Product (PRO-BIOTIC BLEND) CAPS Take 1 capsule by mouth daily.     No current facility-administered medications on file prior to visit.    Review of Systems Review of Systems  Constitutional: Negative for fever, appetite change, fatigue and unexpected weight change.  Eyes: Negative for pain and visual disturbance.  Respiratory: Negative for cough and shortness of breath.   Cardiovascular: Negative for cp or palpitations    Gastrointestinal: Negative for nausea, diarrhea and constipation.  Genitourinary: Negative for urgency and frequency.  Skin:  Negative for pallor or rash   MSK pos for leg cramps / especially at night  Neurological: Negative for weakness, light-headedness, numbness and headaches.  Hematological: Negative for adenopathy. Does not bruise/bleed easily.  Psychiatric/Behavioral: Negative for dysphoric mood. The patient is not nervous/anxious.  pos for stressors     Objective:   Physical Exam  Constitutional: She appears well-developed and  well-nourished. No distress.  overwt and well app  HENT:  Head: Normocephalic and atraumatic.  Right Ear: External ear normal.  Left Ear: External ear normal.  Mouth/Throat: Oropharynx is clear and moist.  Eyes: Conjunctivae and EOM are normal. Pupils are equal, round, and reactive to light. No scleral icterus.  Neck: Normal range of motion. Neck supple. No JVD present. Carotid bruit is not present. No thyromegaly present.  Cardiovascular: Normal rate, regular rhythm, normal heart sounds and intact distal pulses.  Exam reveals no gallop.   Pulmonary/Chest: Effort normal and breath sounds normal. No respiratory distress. She has no wheezes. She exhibits no tenderness.  Abdominal: Soft. Bowel sounds are normal. She exhibits no distension, no abdominal bruit and no mass. There is no tenderness.  Genitourinary: No breast swelling, tenderness, discharge or bleeding.  Musculoskeletal: Normal range of motion. She exhibits no edema or tenderness.  Mild kyphosis   Lymphadenopathy:    She has no cervical adenopathy.  Neurological: She is alert. She has normal reflexes. No cranial nerve deficit. She exhibits normal muscle tone. Coordination normal.  Skin: Skin is warm and dry. No rash noted. No erythema. No pallor.  Psychiatric: She has a normal mood and affect.          Assessment & Plan:   Problem List Items Addressed This Visit      Musculoskeletal and Integument   Osteoporosis - Primary    Pt never started the evista we px but is ready now -sent to the pharmacy (update if side  eff or problems) Does not want to schedule another dexa yet  Will inc D to 5000 iu daily in light of low level in 20s No fractures   Disc need for calcium/ vitamin D/ wt bearing exercise and bone density test every 2 y to monitor Disc safety/ fracture risk in detail        Relevant Medications   raloxifene (EVISTA) 60 MG tablet     Other   Colon cancer screening    Given IFOB stool kit for screening       Relevant Orders   Fecal occult blood, imunochemical   Encounter for Medicare annual wellness exam    Reviewed health habits including diet and exercise and skin cancer prevention Reviewed appropriate screening tests for age  Also reviewed health mt list, fam hx and immunization status , as well as social and family history   See  HPI Labs reviewed Don't forget to schedule your mammogram  Do stool kit for screening  Flu and prevnar vaccines today  Try magnesium - 250 to 500 mg daily to help with leg cramps daily       Hyperlipidemia    Stable and controlled with diet  Disc goals for lipids and reasons to control them Rev labs with pt Rev low sat fat diet in detail       Routine general medical examination at a health care facility    Reviewed health habits including diet and exercise and skin cancer prevention Reviewed appropriate screening tests for age  Also reviewed health mt list, fam hx and immunization status , as well as social and family history   See  HPI Labs reviewed Don't forget to schedule your mammogram  Do stool kit for screening  Flu and prevnar vaccines today  Try magnesium - 250 to 500 mg daily to help with leg cramps daily   I will evista px - check on price at different pharmacies/this is to prevent broken  bones  Do work on the advance directive (blue booklet)      Vitamin D deficiency    Level is in 14s  Pt increasing dose to 5000 iu daily  Will continue to follow Disc imp to bone and overall health Does have OP        Other Visit  Diagnoses    Need for influenza vaccination        Relevant Orders    Flu Vaccine QUAD 36+ mos PF IM (Fluarix & Fluzone Quad PF) (Completed)    Need for vaccination with 13-polyvalent pneumococcal conjugate vaccine        Relevant Orders    Pneumococcal conjugate vaccine 13-valent (Completed)

## 2014-12-29 NOTE — Assessment & Plan Note (Signed)
Stable and controlled with diet  Disc goals for lipids and reasons to control them Rev labs with pt Rev low sat fat diet in detail

## 2014-12-29 NOTE — Assessment & Plan Note (Signed)
Pt never started the evista we px but is ready now -sent to the pharmacy (update if side eff or problems) Does not want to schedule another dexa yet  Will inc D to 5000 iu daily in light of low level in 20s No fractures   Disc need for calcium/ vitamin D/ wt bearing exercise and bone density test every 2 y to monitor Disc safety/ fracture risk in detail

## 2014-12-29 NOTE — Assessment & Plan Note (Signed)
Reviewed health habits including diet and exercise and skin cancer prevention Reviewed appropriate screening tests for age  Also reviewed health mt list, fam hx and immunization status , as well as social and family history   See  HPI Labs reviewed Don't forget to schedule your mammogram  Do stool kit for screening  Flu and prevnar vaccines today  Try magnesium - 250 to 500 mg daily to help with leg cramps daily   I will evista px - check on price at different pharmacies/this is to prevent broken bones  Do work on the advance directive (blue booklet)

## 2014-12-29 NOTE — Assessment & Plan Note (Signed)
Given IFOB stool kit for screening

## 2014-12-29 NOTE — Assessment & Plan Note (Signed)
Level is in 20s  Pt increasing dose to 5000 iu daily  Will continue to follow Disc imp to bone and overall health Does have OP

## 2014-12-29 NOTE — Assessment & Plan Note (Signed)
Reviewed health habits including diet and exercise and skin cancer prevention Reviewed appropriate screening tests for age  Also reviewed health mt list, fam hx and immunization status , as well as social and family history   See  HPI Labs reviewed Don't forget to schedule your mammogram  Do stool kit for screening  Flu and prevnar vaccines today  Try magnesium - 250 to 500 mg daily to help with leg cramps daily  

## 2015-01-11 ENCOUNTER — Encounter: Payer: Self-pay | Admitting: Family Medicine

## 2015-03-03 ENCOUNTER — Other Ambulatory Visit (INDEPENDENT_AMBULATORY_CARE_PROVIDER_SITE_OTHER): Payer: Medicare Other

## 2015-03-03 DIAGNOSIS — Z1211 Encounter for screening for malignant neoplasm of colon: Secondary | ICD-10-CM | POA: Diagnosis not present

## 2015-03-03 LAB — FECAL OCCULT BLOOD, IMMUNOCHEMICAL: Fecal Occult Bld: NEGATIVE

## 2015-03-03 LAB — FECAL OCCULT BLOOD, GUAIAC: Fecal Occult Blood: NEGATIVE

## 2015-03-07 ENCOUNTER — Encounter: Payer: Self-pay | Admitting: Family Medicine

## 2015-03-07 ENCOUNTER — Encounter: Payer: Self-pay | Admitting: *Deleted

## 2015-03-16 ENCOUNTER — Telehealth: Payer: Self-pay | Admitting: Family Medicine

## 2015-03-16 MED ORDER — OSELTAMIVIR PHOSPHATE 75 MG PO CAPS: 75.0000 mg | ORAL_CAPSULE | Freq: Every day | ORAL | Status: DC

## 2015-03-16 NOTE — Telephone Encounter (Signed)
Patient is aware 

## 2015-03-16 NOTE — Telephone Encounter (Signed)
Pt left VM requesting Tamiflu be called in to [facility].  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  Best number to call is [phone]

## 2015-03-16 NOTE — Telephone Encounter (Signed)
I sent it  Wear a mask around flu patients if possible

## 2015-03-22 ENCOUNTER — Other Ambulatory Visit: Payer: Self-pay | Admitting: *Deleted

## 2015-03-22 MED ORDER — ZOLPIDEM TARTRATE 10 MG PO TABS: 10.0000 mg | ORAL_TABLET | Freq: Every evening | ORAL | Status: DC | PRN

## 2015-03-22 NOTE — Telephone Encounter (Signed)
Pt had CPE on 12/28/14 last refilled on 11/16/14 #30 with 3 additional refills, please advise

## 2015-03-22 NOTE — Telephone Encounter (Signed)
Rx called in as prescribed 

## 2015-03-22 NOTE — Telephone Encounter (Signed)
Px written for call in   

## 2015-07-25 ENCOUNTER — Other Ambulatory Visit: Payer: Self-pay

## 2015-07-25 MED ORDER — ZOLPIDEM TARTRATE 10 MG PO TABS
10.0000 mg | ORAL_TABLET | Freq: Every evening | ORAL | Status: DC | PRN
Start: 2015-07-25 — End: 2015-11-24

## 2015-07-25 NOTE — Telephone Encounter (Signed)
Rx called in as directed.   

## 2015-07-25 NOTE — Telephone Encounter (Signed)
Pt left v/m requesting status of refill zolpidem faxed from [facility] on 07/22/15. I spoke with Chloe at [facility] and was going to refill but looked at date 03/22/15;advised Chloe would need to send to provider in office for refill of zolpidem. Chloe voiced understanding and will wait for OK for refill. Pt last seen for annual exam on 12/28/14; last refilled # 30 x 3 on 03/22/15. Pt is out of med and request to be refilled on 07/25/15.Please advise.

## 2015-07-25 NOTE — Telephone Encounter (Signed)
plz phone in. 

## 2015-11-23 ENCOUNTER — Other Ambulatory Visit: Payer: Self-pay | Admitting: *Deleted

## 2015-11-23 MED ORDER — TRAMADOL HCL 50 MG PO TABS: 50.0000 mg | ORAL_TABLET | Freq: Three times a day (TID) | ORAL | 0 refills | Status: DC | PRN

## 2015-11-23 NOTE — Telephone Encounter (Signed)
Pt has CPE scheduled on 01/24/16 last filled on 12/28/14 #30 tabs with 3 refills, please advise

## 2015-11-23 NOTE — Telephone Encounter (Signed)
Rx called in as prescribed 

## 2015-11-23 NOTE — Telephone Encounter (Signed)
Px written for call in   

## 2015-11-24 MED ORDER — ZOLPIDEM TARTRATE 10 MG PO TABS: 10.0000 mg | ORAL_TABLET | Freq: Every evening | ORAL | 3 refills | Status: DC | PRN

## 2015-11-24 NOTE — Telephone Encounter (Signed)
My fault-I don't remember seeing the ambien  Px written for call in

## 2015-11-24 NOTE — Telephone Encounter (Signed)
Pt left v/m wanting to know why tramadol was approved and not ambien. Pt request cb. Lorrin Mais last refilled # 30 x 3 on 07/25/15. Please advise.

## 2015-11-24 NOTE — Addendum Note (Signed)
Addended by: Helene Shoe on: 11/24/2015 11:31 AM   Modules accepted: Orders

## 2015-11-24 NOTE — Telephone Encounter (Signed)
Rx called in as prescribed, we never received a refill request for the ambien only the tramadol pt advise but I did tell her we sent Rx in now and to check with pharmacy later to see when it will be ready

## 2015-11-24 NOTE — Addendum Note (Signed)
Addended by: Loura Pardon A on: 11/24/2015 01:56 PM   Modules accepted: Orders

## 2016-01-08 ENCOUNTER — Telehealth: Payer: Self-pay | Admitting: Family Medicine

## 2016-01-08 DIAGNOSIS — E559 Vitamin D deficiency, unspecified: Secondary | ICD-10-CM

## 2016-01-08 DIAGNOSIS — Z0000 Encounter for general adult medical examination without abnormal findings: Secondary | ICD-10-CM

## 2016-01-08 NOTE — Telephone Encounter (Signed)
-----   Message from Marchia Bond sent at 01/04/2016 11:47 AM EST ----- Regarding: Cpx labs Wed 12/20, need orders. Thanks! :-) Please order  future cpx labs for pt's upcoming lab appt. Thanks Aniceto Boss

## 2016-01-11 ENCOUNTER — Ambulatory Visit: Payer: Medicare Other

## 2016-01-11 ENCOUNTER — Other Ambulatory Visit: Payer: Medicare Other

## 2016-01-20 ENCOUNTER — Ambulatory Visit (INDEPENDENT_AMBULATORY_CARE_PROVIDER_SITE_OTHER): Payer: Medicare Other

## 2016-01-20 VITALS — BP 112/80 | HR 67 | Temp 97.8°F | Ht 65.0 in | Wt 188.2 lb

## 2016-01-20 DIAGNOSIS — E559 Vitamin D deficiency, unspecified: Secondary | ICD-10-CM

## 2016-01-20 DIAGNOSIS — Z23 Encounter for immunization: Secondary | ICD-10-CM

## 2016-01-20 DIAGNOSIS — Z1159 Encounter for screening for other viral diseases: Secondary | ICD-10-CM

## 2016-01-20 DIAGNOSIS — Z0000 Encounter for general adult medical examination without abnormal findings: Secondary | ICD-10-CM

## 2016-01-20 LAB — COMPREHENSIVE METABOLIC PANEL
ALK PHOS: 71 U/L (ref 39–117)
ALT: 16 U/L (ref 0–35)
AST: 17 U/L (ref 0–37)
Albumin: 4.2 g/dL (ref 3.5–5.2)
BILIRUBIN TOTAL: 0.6 mg/dL (ref 0.2–1.2)
BUN: 14 mg/dL (ref 6–23)
CO2: 32 meq/L (ref 19–32)
Calcium: 9.6 mg/dL (ref 8.4–10.5)
Chloride: 105 mEq/L (ref 96–112)
Creatinine, Ser: 0.81 mg/dL (ref 0.40–1.20)
GFR: 74.4 mL/min (ref >60.00)
Glucose, Bld: 106 mg/dL — ABNORMAL HIGH (ref 70–99)
Potassium: 4.8 mEq/L (ref 3.5–5.1)
Sodium: 142 mEq/L (ref 135–145)
TOTAL PROTEIN: 7.1 g/dL (ref 6.0–8.3)

## 2016-01-20 LAB — LIPID PANEL
Cholesterol: 203 mg/dL — ABNORMAL HIGH (ref 0–200)
HDL: 81.3 mg/dL (ref >39.00)
LDL Cholesterol: 105 mg/dL — ABNORMAL HIGH (ref 0–99)
NonHDL: 121.56
TRIGLYCERIDES: 84 mg/dL (ref 0.0–149.0)
Total CHOL/HDL Ratio: 2
VLDL: 16.8 mg/dL (ref 0.0–40.0)

## 2016-01-20 LAB — CBC WITH DIFFERENTIAL/PLATELET
BASOS ABS: 0 10*3/uL (ref 0.0–0.1)
Basophils Relative: 0.4 % (ref 0.0–3.0)
EOS PCT: 1.5 % (ref 0.0–5.0)
Eosinophils Absolute: 0.1 10*3/uL (ref 0.0–0.7)
HEMATOCRIT: 41.7 % (ref 36.0–46.0)
Hemoglobin: 14.1 g/dL (ref 12.0–15.0)
LYMPHS PCT: 23.7 % (ref 12.0–46.0)
Lymphs Abs: 1.4 10*3/uL (ref 0.7–4.0)
MCHC: 33.7 g/dL (ref 30.0–36.0)
MCV: 91.3 fl (ref 78.0–100.0)
MONOS PCT: 9.8 % (ref 3.0–12.0)
Monocytes Absolute: 0.6 10*3/uL (ref 0.1–1.0)
NEUTROS ABS: 3.9 10*3/uL (ref 1.4–7.7)
Neutrophils Relative %: 64.6 % (ref 43.0–77.0)
PLATELETS: 313 10*3/uL (ref 150.0–400.0)
RBC: 4.57 Mil/uL (ref 3.87–5.11)
RDW: 14.5 % (ref 11.5–15.5)
WBC: 6.1 10*3/uL (ref 4.0–10.5)

## 2016-01-20 LAB — TSH: TSH: 0.95 u[IU]/mL (ref 0.35–4.50)

## 2016-01-20 LAB — VITAMIN D 25 HYDROXY (VIT D DEFICIENCY, FRACTURES): VITD: 31.38 ng/mL (ref 30.00–100.00)

## 2016-01-20 NOTE — Progress Notes (Signed)
Pre visit review using our clinic review tool, if applicable. No additional management support is needed unless otherwise documented below in the visit note. 

## 2016-01-20 NOTE — Progress Notes (Signed)
Subjective:   [name] [name] is a 70 y.o. female who presents for Medicare Annual (Subsequent) preventive examination.  Review of Systems:  N/A Cardiac Risk Factors include: advanced age (>63men, >49 women);dyslipidemia;obesity (BMI >30kg/m2)     Objective:     Vitals: BP 112/80 (BP Location: Left Arm, Patient Position: Sitting, Cuff Size: Normal)   Pulse 67   Temp 97.8 F (36.6 C) (Oral)   Ht 5\' 5"  (1.651 m) Comment: no shoes  Wt 188 lb 4 oz (85.4 kg)   SpO2 97%   BMI 31.33 kg/m   Body mass index is 31.33 kg/m.   Tobacco History  Smoking Status  . Former Smoker  . Quit date: 01/22/2005  Smokeless Tobacco  . Never Used     Counseling given: No   Past Medical History:  Diagnosis Date  . Arthritis   . Depression   . GERD (gastroesophageal reflux disease)   . Hyperlipidemia   . Osteoporosis   . Vitamin D deficiency    Past Surgical History:  Procedure Laterality Date  . ABDOMINAL HYSTERECTOMY    . SHOULDER SURGERY     right   Family History  Problem Relation Age of Onset  . Diabetes Father   . Cancer Father     lung cancer  . Cancer Sister     breast cancer  . Cancer Brother     lung cancer  . Coronary artery disease Paternal Uncle   . Cancer Maternal Grandfather     possible prostate  . Coronary artery disease Paternal Grandfather   . Heart disease Paternal Grandfather     CAD   History  Sexual Activity  . Sexual activity: Not on file    Outpatient Encounter Prescriptions as of 01/20/2016  Medication Sig  . Calcium Carb-Cholecalciferol (CALCIUM 600 + D PO) Take 2 tablets by mouth daily.  . cholecalciferol (VITAMIN D) 1000 UNITS tablet Take 3,000 Units by mouth daily.   . traMADol (ULTRAM) 50 MG tablet Take 1 tablet (50 mg total) by mouth every 8 (eight) hours as needed.  . tretinoin (RETIN-A) 0.05 % cream   . zolpidem (AMBIEN) 10 MG tablet Take 1 tablet (10 mg total) by mouth at bedtime as needed for sleep.  . [DISCONTINUED] oseltamivir  (TAMIFLU) 75 MG capsule Take 1 capsule (75 mg total) by mouth daily. (Patient not taking: Reported on 01/20/2016)  . [DISCONTINUED] Probiotic Product (PRO-BIOTIC BLEND) CAPS Take 1 capsule by mouth daily.  . [DISCONTINUED] raloxifene (EVISTA) 60 MG tablet Take 1 tablet (60 mg total) by mouth daily.   No facility-administered encounter medications on file as of 01/20/2016.     Activities of Daily Living In your present state of health, do you have any difficulty performing the following activities: 01/20/2016  Hearing? N  Vision? N  Difficulty concentrating or making decisions? N  Walking or climbing stairs? N  Dressing or bathing? N  Doing errands, shopping? N  Preparing Food and eating ? N  Using the Toilet? N  In the past six months, have you accidently leaked urine? N  Do you have problems with loss of bowel control? N  Managing your Medications? N  Managing your Finances? N  Housekeeping or managing your Housekeeping? N  Some recent data might be hidden    Patient Care Team: Abner Greenspan, MD as PCP - General    Assessment:     Hearing Screening   125Hz  250Hz  500Hz  1000Hz  2000Hz  3000Hz  4000Hz  6000Hz  8000Hz   Right  ear:   0 40 40  40    Left ear:   40 0 40  0      Visual Acuity Screening   Right eye Left eye Both eyes  Without correction:     With correction: 20/40 20/40 20/40    Exercise Activities and Dietary recommendations Current Exercise Habits: The patient does not participate in regular exercise at present, Exercise limited by: None identified  Goals    . Increase physical activity          Starting 03/22/2016, I will walk at least 1 mile twice weekly.       Fall Risk Fall Risk  01/20/2016 12/28/2014 12/14/2013 11/11/2012  Falls in the past year? No No No No   Depression Screen PHQ 2/9 Scores 01/20/2016 12/28/2014 12/14/2013 11/11/2012  PHQ - 2 Score 0 0 0 0     Cognitive Function MMSE - Mini Mental State Exam 01/20/2016  Orientation to time 5    Orientation to Place 5  Registration 3  Attention/ Calculation 0  Recall 3  Language- name 2 objects 0  Language- repeat 1  Language- follow 3 step command 3  Language- read & follow direction 0  Write a sentence 0  Copy design 0  Total score 20     PLEASE NOTE: A Mini-Cog screen was completed. Maximum score is 20. A value of 0 denotes this part of Folstein MMSE was not completed or the patient failed this part of the Mini-Cog screening.   Mini-Cog Screening Orientation to Time - Max 5 pts Orientation to Place - Max 5 pts Registration - Max 3 pts Recall - Max 3 pts Language Repeat - Max 1 pts Language Follow 3 Step Command - Max 3 pts     Immunization History  Administered Date(s) Administered  . Influenza Whole 12/14/2008  . Influenza,inj,Quad PF,36+ Mos 11/11/2012, 12/28/2014, 01/20/2016  . Pneumococcal Conjugate-13 12/28/2014  . Pneumococcal Polysaccharide-23 03/11/2007, 01/20/2016  . Td 01/22/2006  . Zoster 12/30/2012   Screening Tests Health Maintenance  Topic Date Due  . MAMMOGRAM  10/15/2016 (Originally 10/17/2014)  . TETANUS/TDAP  01/23/2016  . COLONOSCOPY  03/01/2016  . COLON CANCER SCREENING ANNUAL FOBT  03/02/2016  . INFLUENZA VACCINE  Addressed  . DEXA SCAN  Completed  . ZOSTAVAX  Completed  . Hepatitis C Screening  Completed  . PNA vac Low Risk Adult  Completed      Plan:     I have personally reviewed and addressed the Medicare Annual Wellness questionnaire and have noted the following in the patient's chart:  A. Medical and social history B. Use of alcohol, tobacco or illicit drugs  C. Current medications and supplements D. Functional ability and status E.  Nutritional status F.  Physical activity G. Advance directives H. List of other physicians I.  Hospitalizations, surgeries, and ER visits in previous 12 months J.  [place] to include hearing, vision, cognitive, depression L. Referrals and appointments - none  In addition,  I have reviewed and discussed with patient certain preventive protocols, quality metrics, and best practice recommendations. A written personalized care plan for preventive services as well as general preventive health recommendations were provided to patient.  See attached scanned questionnaire for additional information.   Signed,   Lindell Noe, MHA, BS, LPN Health Coach

## 2016-01-20 NOTE — Patient Instructions (Signed)
[name] [name] , Thank you for taking time to come for your Medicare Wellness Visit. I appreciate your ongoing commitment to your health goals. Please review the following plan we discussed and let me know if I can assist you in the future.   These are the goals we discussed: Goals    . Increase physical activity          Starting 03/22/2016, I will walk at least 1 mile twice weekly.        This is a list of the screening recommended for you and due dates:  Health Maintenance  Topic Date Due  . Mammogram  10/15/2016*  . Tetanus Vaccine  01/23/2016  . Colon Cancer Screening  03/01/2016  . Stool Blood Test  03/02/2016  . Flu Shot  Addressed  . DEXA scan (bone density measurement)  Completed  . Shingles Vaccine  Completed  .  Hepatitis C: One time screening is recommended by Center for Disease Control  (CDC) for  adults born from [birth year] through 1964.   Completed  . Pneumonia vaccines  Completed  *Topic was postponed. The date shown is not the original due date.   Preventive Care for Adults  A healthy lifestyle and preventive care can promote health and wellness. Preventive health guidelines for adults include the following key practices.  . A routine yearly physical is a good way to check with your health care provider about your health and preventive screening. It is a chance to share any concerns and updates on your health and to receive a thorough exam.  . Visit your dentist for a routine exam and preventive care every 6 months. Brush your teeth twice a day and floss once a day. Good oral hygiene prevents tooth decay and gum disease.  . The frequency of eye exams is based on your age, health, family medical history, use  of contact lenses, and other factors. Follow your health care provider's ecommendations for frequency of eye exams.  . Eat a healthy diet. Foods like vegetables, fruits, whole grains, low-fat dairy products, and lean protein foods contain the nutrients you need without  too many calories. Decrease your intake of foods high in solid fats, added sugars, and salt. Eat the right amount of calories for you. Get information about a proper diet from your health care provider, if necessary.  . Regular physical exercise is one of the most important things you can do for your health. Most adults should get at least 150 minutes of moderate-intensity exercise (any activity that increases your heart rate and causes you to sweat) each week. In addition, most adults need muscle-strengthening exercises on 2 or more days a week.  Silver Sneakers may be a benefit available to you. To determine eligibility, you may visit the website: www.silversneakers.com or contact program at [phone] Mon-Fri between 8AM-8PM.   . Maintain a healthy weight. The body mass index (BMI) is a screening tool to identify possible weight problems. It provides an estimate of body fat based on height and weight. Your health care provider can find your BMI and can help you achieve or maintain a healthy weight.   For adults 20 years and older: ? A BMI below 18.5 is considered underweight. ? A BMI of 18.5 to 24.9 is normal. ? A BMI of 25 to 29.9 is considered overweight. ? A BMI of 30 and above is considered obese.   . Maintain normal blood lipids and cholesterol levels by exercising and minimizing your intake of saturated  fat. Eat a balanced diet with plenty of fruit and vegetables. Blood tests for lipids and cholesterol should begin at age 69 and be repeated every 5 years. If your lipid or cholesterol levels are high, you are over 50, or you are at high risk for heart disease, you may need your cholesterol levels checked more frequently. Ongoing high lipid and cholesterol levels should be treated with medicines if diet and exercise are not working.  . If you smoke, find out from your health care provider how to quit. If you do not use tobacco, please do not start.  . If you choose to drink alcohol,  please do not consume more than 2 drinks per day. One drink is considered to be 12 ounces (355 mL) of beer, 5 ounces (148 mL) of wine, or 1.5 ounces (44 mL) of liquor.  . If you are 70-57 years old, ask your health care provider if you should take aspirin to prevent strokes.  . Use sunscreen. Apply sunscreen liberally and repeatedly throughout the day. You should seek shade when your shadow is shorter than you. Protect yourself by wearing long sleeves, pants, a wide-brimmed hat, and sunglasses year round, whenever you are outdoors.  . Once a month, do a whole body skin exam, using a mirror to look at the skin on your back. Tell your health care provider of new moles, moles that have irregular borders, moles that are larger than a pencil eraser, or moles that have changed in shape or color.

## 2016-01-20 NOTE — Progress Notes (Signed)
PCP notes:   Health maintenance:  Mammogram - pt will discuss with PCP at CPE Flu vaccine - administered PPSV23 - administered Hep C screening - completed  Abnormal screenings:   Hearing - failed  Patient concerns:   None  Nurse concerns:  None  Next PCP appt:   01/24/16 @ 1030  I reviewed health advisor's note, was available for consultation, and agree with documentation and plan. Loura Pardon MD

## 2016-01-21 LAB — HEPATITIS C ANTIBODY: HCV AB: NEGATIVE

## 2016-01-24 ENCOUNTER — Encounter: Payer: Self-pay | Admitting: Family Medicine

## 2016-01-24 ENCOUNTER — Ambulatory Visit (INDEPENDENT_AMBULATORY_CARE_PROVIDER_SITE_OTHER): Payer: Medicare Other | Admitting: Family Medicine

## 2016-01-24 VITALS — BP 118/76 | HR 75 | Temp 98.0°F | Ht 65.0 in | Wt 192.2 lb

## 2016-01-24 DIAGNOSIS — E559 Vitamin D deficiency, unspecified: Secondary | ICD-10-CM

## 2016-01-24 DIAGNOSIS — Z1211 Encounter for screening for malignant neoplasm of colon: Secondary | ICD-10-CM

## 2016-01-24 DIAGNOSIS — R7309 Other abnormal glucose: Secondary | ICD-10-CM | POA: Insufficient documentation

## 2016-01-24 DIAGNOSIS — E7800 Pure hypercholesterolemia, unspecified: Secondary | ICD-10-CM | POA: Diagnosis not present

## 2016-01-24 DIAGNOSIS — M810 Age-related osteoporosis without current pathological fracture: Secondary | ICD-10-CM | POA: Diagnosis not present

## 2016-01-24 DIAGNOSIS — Z0000 Encounter for general adult medical examination without abnormal findings: Secondary | ICD-10-CM

## 2016-01-24 MED ORDER — TRAMADOL HCL 50 MG PO TABS: 50.0000 mg | ORAL_TABLET | Freq: Three times a day (TID) | ORAL | 0 refills | Status: DC | PRN

## 2016-01-24 NOTE — Progress Notes (Signed)
Pre visit review using our clinic review tool, if applicable. No additional management support is needed unless otherwise documented below in the visit note. 

## 2016-01-24 NOTE — Assessment & Plan Note (Signed)
Mild  Disc limiting carbs/simple sugars Will plan a1c next time

## 2016-01-24 NOTE — Assessment & Plan Note (Signed)
Vitamin D level is therapeutic with current supplementation Disc importance of this to bone and overall health  

## 2016-01-24 NOTE — Assessment & Plan Note (Signed)
Declines colonoscopy  Signed up for cologuard testing

## 2016-01-24 NOTE — Assessment & Plan Note (Signed)
Disc goals for lipids and reasons to control them Rev labs with pt Rev low sat fat diet in detail Good control with diet and exercise

## 2016-01-24 NOTE — Assessment & Plan Note (Signed)
Reviewed health habits including diet and exercise and skin cancer prevention Reviewed appropriate screening tests for age  Also reviewed health mt list, fam hx and immunization status , as well as social and family history   See HPI AMW rev-does not want hearing intervention Rev labs  Disc exercise  Form filled out for foster parent-no restrictions dexa -declined Colonoscopy declined-set up for cologuard Will schedule her own mammo Hep C neg Will get tetanus shot at a pharmacy

## 2016-01-24 NOTE — Patient Instructions (Addendum)
Until you can walk outside again- add 20 minutes of something indoors like a video  Stay active  Call your insurance regarding a tetanus shot- you are due and it may be covered in a pharmacy  Don't forget to get your annual mammogram  We will sign you up for the cologuard program for colon screening  Take care of yourself

## 2016-01-24 NOTE — Progress Notes (Signed)
Subjective:    Patient ID: [name] [name], female    DOB: December 21, 1945, 71 y.o.   MRN: [id number]  HPI Here for health maintenance exam and to review chronic medical problems    Plans to adopt the grand kids she cares for  75, and 10 - one is going off to college  Also interested in foster parenting as well -she enjoys it   Mother passed away a few months ago- some grief but has peace now   Feeling fine   Had amw last month Given flu vaccine and pna 23 Hep c screening completed-neg  Failed hearing screen -worse L ear  Has not noticed any problems  ? If wax  Not ready for hearing aides   Wt Readings from Last 3 Encounters:  01/24/16 192 lb 4 oz (87.2 kg)  01/20/16 188 lb 4 oz (85.4 kg)  12/28/14 185 lb 12 oz (84.3 kg)  possible gain from holidays  Up from a year ago  Not a lot of exercise due to cold weather - did well in better weather to walk  Stays active cleaning   bmi is 31.9  Tetanus shot due -will check into pharmacy coverage   Mammogram 9/14-negative - she usually goes [facility]- has been busy and put it off Self breast exam -no lumps  Sister had breast cancer   Colonoscopy 2/08 hyperplastic polyps  Due this feb She does not want to do it  ifob 2/17-negative   dexa 9/14 osteoporosis  Vit D 31.3-improved She takes her ca and D  No falls or broken bones Active and exercises  Declines another bone density test  Tried evista and it gave her cramps bisphosphenate gave her GI problems   Hx of hyperlipidemia Lab Results  Component Value Date   CHOL 203 (H) 01/20/2016   CHOL 190 12/24/2014   CHOL 192 11/11/2013   Lab Results  Component Value Date   HDL 81.30 01/20/2016   HDL 70.60 12/24/2014   HDL 64.30 11/11/2013   Lab Results  Component Value Date   LDLCALC 105 (H) 01/20/2016   LDLCALC 107 (H) 12/24/2014   LDLCALC 105 (H) 11/11/2013   Lab Results  Component Value Date   TRIG 84.0 01/20/2016   TRIG 63.0 12/24/2014   TRIG  112.0 11/11/2013   Lab Results  Component Value Date   CHOLHDL 2 01/20/2016   CHOLHDL 3 12/24/2014   CHOLHDL 3 11/11/2013   Lab Results  Component Value Date   LDLDIRECT 123.3 09/14/2011   LDLDIRECT 151.1 12/14/2008   LDLDIRECT 122.8 03/11/2007    Good HDL and LDL now  Improved She eats a healthy diet and stays active   Results for orders placed or performed in visit on 01/20/16  VITAMIN D 25 Hydroxy (Vit-D Deficiency, Fractures)  Result Value Ref Range   VITD 31.38 30.00 - 100.00 ng/mL  CBC with Differential/Platelet  Result Value Ref Range   WBC 6.1 4.0 - 10.5 K/uL   RBC 4.57 3.87 - 5.11 Mil/uL   Hemoglobin 14.1 12.0 - 15.0 g/dL   HCT 41.7 36.0 - 46.0 %   MCV 91.3 78.0 - 100.0 fl   MCHC 33.7 30.0 - 36.0 g/dL   RDW 14.5 11.5 - 15.5 %   Platelets 313.0 150.0 - 400.0 K/uL   Neutrophils Relative % 64.6 43.0 - 77.0 %   Lymphocytes Relative 23.7 12.0 - 46.0 %   Monocytes Relative 9.8 3.0 - 12.0 %   Eosinophils  Relative 1.5 0.0 - 5.0 %   Basophils Relative 0.4 0.0 - 3.0 %   Neutro Abs 3.9 1.4 - 7.7 K/uL   Lymphs Abs 1.4 0.7 - 4.0 K/uL   Monocytes Absolute 0.6 0.1 - 1.0 K/uL   Eosinophils Absolute 0.1 0.0 - 0.7 K/uL   Basophils Absolute 0.0 0.0 - 0.1 K/uL  Comprehensive metabolic panel  Result Value Ref Range   Sodium 142 135 - 145 mEq/L   Potassium 4.8 3.5 - 5.1 mEq/L   Chloride 105 96 - 112 mEq/L   CO2 32 19 - 32 mEq/L   Glucose, Bld 106 (H) 70 - 99 mg/dL   BUN 14 6 - 23 mg/dL   Creatinine, Ser 0.81 0.40 - 1.20 mg/dL   Total Bilirubin 0.6 0.2 - 1.2 mg/dL   Alkaline Phosphatase 71 39 - 117 U/L   AST 17 0 - 37 U/L   ALT 16 0 - 35 U/L   Total Protein 7.1 6.0 - 8.3 g/dL   Albumin 4.2 3.5 - 5.2 g/dL   Calcium 9.6 8.4 - 10.5 mg/dL   GFR 74.40 >60.00 mL/min  Lipid panel  Result Value Ref Range   Cholesterol 203 (H) 0 - 200 mg/dL   Triglycerides 84.0 0.0 - 149.0 mg/dL   HDL 81.30 >39.00 mg/dL   VLDL 16.8 0.0 - 40.0 mg/dL   LDL Cholesterol 105 (H) 0 - 99 mg/dL    Total CHOL/HDL Ratio 2    NonHDL 121.56   TSH  Result Value Ref Range   TSH 0.95 0.35 - 4.50 uIU/mL  Hepatitis C antibody  Result Value Ref Range   HCV Ab NEGATIVE NEGATIVE       Review of Systems Review of Systems  Constitutional: Negative for fever, appetite change, fatigue and unexpected weight change.  Eyes: Negative for pain and visual disturbance.  Respiratory: Negative for cough and shortness of breath.   Cardiovascular: Negative for cp or palpitations    Gastrointestinal: Negative for nausea, diarrhea and constipation.  Genitourinary: Negative for urgency and frequency.  Skin: Negative for pallor or rash   Neurological: Negative for weakness, light-headedness, numbness and headaches.  Hematological: Negative for adenopathy. Does not bruise/bleed easily.  Psychiatric/Behavioral: Negative for dysphoric mood. The patient is not nervous/anxious.         Objective:   Physical Exam  Constitutional: She appears well-developed and well-nourished. No distress.  obese and well appearing   HENT:  Head: Normocephalic and atraumatic.  Right Ear: External ear normal.  Left Ear: External ear normal.  Mouth/Throat: Oropharynx is clear and moist.  Eyes: Conjunctivae and EOM are normal. Pupils are equal, round, and reactive to light. No scleral icterus.  Neck: Normal range of motion. Neck supple. No JVD present. Carotid bruit is not present. No thyromegaly present.  Cardiovascular: Normal rate, regular rhythm, normal heart sounds and intact distal pulses.  Exam reveals no gallop.   Spider veins on LEs bilat    Pulmonary/Chest: Effort normal and breath sounds normal. No respiratory distress. She has no wheezes. She exhibits no tenderness.  Abdominal: Soft. Bowel sounds are normal. She exhibits no distension, no abdominal bruit and no mass. There is no tenderness.  Genitourinary: No breast swelling, tenderness, discharge or bleeding.  Genitourinary Comments: Breast exam: No mass,  nodules, thickening, tenderness, bulging, retraction, inflamation, nipple discharge or skin changes noted.  No axillary or clavicular LA.      Musculoskeletal: Normal range of motion. She exhibits no edema or tenderness.  No kyphosis  Lymphadenopathy:    She has no cervical adenopathy.  Neurological: She is alert. She has normal reflexes. No cranial nerve deficit. She exhibits normal muscle tone. Coordination normal.  Skin: Skin is warm and dry. No rash noted. No erythema. No pallor.  Lentigines diffusley  Psychiatric: She has a normal mood and affect.          Assessment & Plan:   Problem List Items Addressed This Visit      Musculoskeletal and Integument   Osteoporosis - Primary    Pt declines further dexa  No falls or fx  Intol of evista and fosamax  On ca and D   Disc need for calcium/ vitamin D/ wt bearing exercise and bone density test every 2 y to monitor Disc safety/ fracture risk in detail          Other   Vitamin D deficiency    Vitamin D level is therapeutic with current supplementation Disc importance of this to bone and overall health       Routine general medical examination at a health care facility    Reviewed health habits including diet and exercise and skin cancer prevention Reviewed appropriate screening tests for age  Also reviewed health mt list, fam hx and immunization status , as well as social and family history   See HPI AMW rev-does not want hearing intervention Rev labs  Disc exercise  Form filled out for foster parent-no restrictions dexa -declined Colonoscopy declined-set up for cologuard Will schedule her own mammo Hep C neg Will get tetanus shot at a pharmacy      Hyperlipidemia    Disc goals for lipids and reasons to control them Rev labs with pt Rev low sat fat diet in detail Good control with diet and exercise       Elevated glucose    Mild  Disc limiting carbs/simple sugars Will plan a1c next time       Colon  cancer screening    Declines colonoscopy  Signed up for cologuard testing

## 2016-01-24 NOTE — Assessment & Plan Note (Signed)
Pt declines further dexa  No falls or fx  Intol of evista and fosamax  On ca and D   Disc need for calcium/ vitamin D/ wt bearing exercise and bone density test every 2 y to monitor Disc safety/ fracture risk in detail

## 2016-03-26 ENCOUNTER — Other Ambulatory Visit: Payer: Self-pay | Admitting: *Deleted

## 2016-03-26 MED ORDER — ZOLPIDEM TARTRATE 10 MG PO TABS: 10.0000 mg | ORAL_TABLET | Freq: Every evening | ORAL | 3 refills | Status: DC | PRN

## 2016-03-26 NOTE — Telephone Encounter (Signed)
Px written for call in   

## 2016-03-26 NOTE — Telephone Encounter (Signed)
Rx called in as prescribed 

## 2016-03-26 NOTE — Telephone Encounter (Signed)
Ok to refill? Last filled 11/24/15 #30 #RF. [facility]. [phone] if any questions.

## 2016-03-28 ENCOUNTER — Telehealth: Payer: Self-pay | Admitting: *Deleted

## 2016-03-28 NOTE — Telephone Encounter (Signed)
Please ask her if she wants to try either of those and let me know  Thanks

## 2016-03-28 NOTE — Telephone Encounter (Signed)
PA was denied it says pt has had to try and fail belsomra and rozerem and it's also a high risk drug for pt's over 65, letter placed in Dr. Marliss Coots inbox

## 2016-03-28 NOTE — Telephone Encounter (Signed)
PA done for pt's zolpidem at [email or web address] I will await a response

## 2016-03-29 NOTE — Telephone Encounter (Signed)
Pt said she will just pay out of pocket for her zolpidem she doesn't want to switch meds

## 2016-03-29 NOTE — Telephone Encounter (Signed)
Aware- thanks for the update-I think it was already sent in

## 2016-06-20 ENCOUNTER — Other Ambulatory Visit: Payer: Self-pay | Admitting: Family Medicine

## 2016-06-20 NOTE — Telephone Encounter (Signed)
Rx called in as prescribed 

## 2016-06-20 NOTE — Telephone Encounter (Signed)
Px written for call in   

## 2016-06-20 NOTE — Telephone Encounter (Signed)
CPE on 01/24/16, last filled on 01/24/16 #30 tabs with 0 refills, please advise

## 2016-07-23 ENCOUNTER — Other Ambulatory Visit: Payer: Self-pay | Admitting: Family Medicine

## 2016-07-23 NOTE — Telephone Encounter (Signed)
CPE was on 01/24/16, last filled on 03/26/16 #30 tablets with 3 additional refills, please advise

## 2016-07-23 NOTE — Telephone Encounter (Signed)
Px written for call in   

## 2016-07-24 NOTE — Telephone Encounter (Signed)
Rx called in as prescribed 

## 2016-08-27 ENCOUNTER — Other Ambulatory Visit: Payer: Self-pay | Admitting: Family Medicine

## 2016-08-27 NOTE — Telephone Encounter (Signed)
Received refill electronically Last refill 06/20/16 #30 Last office visit 01/24/16

## 2016-08-27 NOTE — Telephone Encounter (Signed)
Px written for call in   

## 2016-08-27 NOTE — Telephone Encounter (Signed)
Rx called in to requested pharmacy 

## 2016-11-23 ENCOUNTER — Other Ambulatory Visit: Payer: Self-pay | Admitting: Family Medicine

## 2016-11-23 NOTE — Telephone Encounter (Addendum)
CPE scheduled on 04/30/16, last filled on 07/23/16 #30 tabs with 3 additional refills, please advise

## 2016-11-23 NOTE — Telephone Encounter (Signed)
Rx called in as prescribed 

## 2016-11-23 NOTE — Telephone Encounter (Signed)
Px written for call in   

## 2016-12-03 ENCOUNTER — Other Ambulatory Visit: Payer: Self-pay | Admitting: Dermatology

## 2016-12-19 ENCOUNTER — Other Ambulatory Visit: Payer: Self-pay | Admitting: Family Medicine

## 2016-12-19 NOTE — Telephone Encounter (Signed)
Rx called in as prescribed 

## 2016-12-19 NOTE — Telephone Encounter (Signed)
Px written for call in   

## 2016-12-19 NOTE — Telephone Encounter (Signed)
Pt has CPE scheduled on 04/30/17, last filled on 08/27/16 #30 tabs with 0 refills, please advise

## 2017-01-02 ENCOUNTER — Other Ambulatory Visit (HOSPITAL_COMMUNITY): Payer: Self-pay | Admitting: Surgery

## 2017-01-02 ENCOUNTER — Ambulatory Visit (HOSPITAL_BASED_OUTPATIENT_CLINIC_OR_DEPARTMENT_OTHER): Payer: Medicare Other | Admitting: Hematology & Oncology

## 2017-01-02 ENCOUNTER — Other Ambulatory Visit: Payer: Self-pay

## 2017-01-02 ENCOUNTER — Encounter: Payer: Self-pay | Admitting: Hematology & Oncology

## 2017-01-02 ENCOUNTER — Other Ambulatory Visit (HOSPITAL_BASED_OUTPATIENT_CLINIC_OR_DEPARTMENT_OTHER): Payer: Medicare Other

## 2017-01-02 DIAGNOSIS — C4A71 Merkel cell carcinoma of right lower limb, including hip: Secondary | ICD-10-CM

## 2017-01-02 DIAGNOSIS — C44792 Other specified malignant neoplasm of skin of right lower limb, including hip: Secondary | ICD-10-CM | POA: Diagnosis not present

## 2017-01-02 DIAGNOSIS — C4A9 Merkel cell carcinoma, unspecified: Secondary | ICD-10-CM | POA: Insufficient documentation

## 2017-01-02 DIAGNOSIS — C4A30 Merkel cell carcinoma of unspecified part of face: Secondary | ICD-10-CM

## 2017-01-02 HISTORY — DX: Merkel cell carcinoma of unspecified part of face: C4A.30

## 2017-01-02 LAB — CMP (CANCER CENTER ONLY)
ALK PHOS: 86 U/L — AB (ref 26–84)
ALT: 23 U/L (ref 10–47)
AST: 28 U/L (ref 11–38)
Albumin: 3.3 g/dL (ref 3.3–5.5)
BUN, Bld: 9 mg/dL (ref 7–22)
CALCIUM: 9.7 mg/dL (ref 8.0–10.3)
CO2: 31 mEq/L (ref 18–33)
Chloride: 100 mEq/L (ref 98–108)
Creat: 0.9 mg/dl (ref 0.6–1.2)
GLUCOSE: 107 mg/dL (ref 73–118)
POTASSIUM: 4 meq/L (ref 3.3–4.7)
Sodium: 144 mEq/L (ref 128–145)
TOTAL PROTEIN: 7.3 g/dL (ref 6.4–8.1)
Total Bilirubin: 0.5 mg/dl (ref 0.20–1.60)

## 2017-01-02 LAB — CBC WITH DIFFERENTIAL (CANCER CENTER ONLY)
BASO#: 0 10*3/uL (ref 0.0–0.2)
BASO%: 0.2 % (ref 0.0–2.0)
EOS%: 1.3 % (ref 0.0–7.0)
Eosinophils Absolute: 0.1 10*3/uL (ref 0.0–0.5)
HCT: 38.3 % (ref 34.8–46.6)
HEMOGLOBIN: 12.4 g/dL (ref 11.6–15.9)
LYMPH#: 1.4 10*3/uL (ref 0.9–3.3)
LYMPH%: 15.2 % (ref 14.0–48.0)
MCH: 29.7 pg (ref 26.0–34.0)
MCHC: 32.4 g/dL (ref 32.0–36.0)
MCV: 92 fL (ref 81–101)
MONO#: 1 10*3/uL — AB (ref 0.1–0.9)
MONO%: 10.7 % (ref 0.0–13.0)
NEUT#: 6.5 10*3/uL (ref 1.5–6.5)
NEUT%: 72.6 % (ref 39.6–80.0)
Platelets: 420 10*3/uL — ABNORMAL HIGH (ref 145–400)
RBC: 4.17 10*6/uL (ref 3.70–5.32)
RDW: 13.1 % (ref 11.1–15.7)
WBC: 8.9 10*3/uL (ref 3.9–10.0)

## 2017-01-02 LAB — TECHNOLOGIST REVIEW CHCC SATELLITE

## 2017-01-02 NOTE — Progress Notes (Signed)
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 New Patient (Initial Visit)  : I had skin cancer removed.  HPI: [name] [name] is an incredibly charming 71 year old white female.  She comes in with her husband.  She has been in good health.  She has not had a mammogram in 2 years however.  We will have to get one set up for her.  Back in the summertime, she noted a lesion on the back of her right lower leg.  This was not bleeding.  It was not painful.  However, it was growing fairly quickly.  She sees Dr. Nena Polio.  As usual, he did a very thorough job.  He knew that this was not atypical skin lesion.  As such, he did a biopsy.  This was done on December 03, 2016.  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  It was 8 mm thick.  There is no size given to it.  She was then referred to Dr. Lucia Gaskins of general surgery.  He is planning to do a wide excision and sentinel node biopsy.  However, he felt that she needed medical oncology to be involved early on to help with any management issues.  She has been set up for a PET scan and a lymph node scan.  She feels well.  She was a smoker.  She stopped about 15 years ago.  She probably has a 40-pack-year history of tobacco use.  She worked for a [employer].  There is a history of cancer in the family with lung cancer and prostate cancer.  She has had a hysterectomy with one ovary left.  Her last colonoscopy was 10 years ago.  She has had other skin lesions removed but these have not been malignant.  She has had no change in bowel or bladder habits.  She has had some slight swelling in the right leg.  Overall, her performance status is ECOG 0.   Past Medical History:  Diagnosis Date  . Arthritis   . Depression   . GERD (gastroesophageal reflux disease)   . Hyperlipidemia   . 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 Osteoporosis   . Vitamin D deficiency   :  Past Surgical History:  Procedure Laterality Date  . ABDOMINAL HYSTERECTOMY    . SHOULDER SURGERY     right  :   Current Outpatient Medications:  .  Calcium Carb-Cholecalciferol (CALCIUM 600 + D PO), Take 2 tablets by mouth daily., Disp: , Rfl:  .  cholecalciferol (VITAMIN D) 1000 UNITS tablet, Take 5,000 Units by mouth daily. , Disp: , Rfl:  .  traMADol (ULTRAM) 50 MG tablet, TAKE 1 TABLET BY MOUTH EVERY 8 HOURS AS NEEDED., Disp: 30 tablet, Rfl: 0 .  zolpidem (AMBIEN) 10 MG tablet, TAKE 1 TABLET BY MOUTH AT BEDTIME AS NEEDED FOR SLEEP, Disp: 30 tablet, Rfl: 3 .  tretinoin (RETIN-A) 0.05 % cream, , Disp: , Rfl: 5:  :  Allergies  Allergen Reactions  . Evista [Raloxifene Hcl]     cramps  . Fosamax [Alendronate Sodium]     GI intolerance   . Gabapentin     REACTION: nausea  . Nsaids     REACTION: GI upset  :  Family History  Problem Relation Age of Onset  . Diabetes Father   . Cancer Father  lung cancer  . Cancer Sister        breast cancer  . Cancer Brother        lung cancer  . Coronary artery disease Paternal Uncle   . Cancer Maternal Grandfather        possible prostate  . Coronary artery disease Paternal Grandfather   . Heart disease Paternal Grandfather        CAD  :  Social History   Socioeconomic History  . Marital status: Married    Spouse name: Not on file  . Number of children: Not on file  . Years of education: Not on file  . Highest education level: Not on file  Social Needs  . Financial resource strain: Not on file  . Food insecurity - worry: Not on file  . Food insecurity - inability: Not on file  . Transportation needs - medical: Not on file  . Transportation needs - non-medical: Not on file  Occupational History  . Not on file  Tobacco Use  . Smoking status: Former Smoker    Last attempt to quit: 01/22/2005    Years since quitting: 11.9  .  Smokeless tobacco: Never Used  Substance and Sexual Activity  . Alcohol use: No    Alcohol/week: 0.0 oz  . Drug use: No  . Sexual activity: Not on file  Other Topics Concern  . Not on file  Social History Narrative  . Not on file  :  Pertinent items are noted in HPI.  Exam: Well-developed and well-nourished white female in no obvious distress.  Her vital signs are temperature of 98.2.  Pulse 95.  Blood pressure 161/78.  Weight is 199 pounds.  Head neck exam shows no ocular or oral lesions.  There are no palpable cervical or supraclavicular lymph nodes.  Lungs are clear bilaterally.  Cardiac exam regular rate and rhythm with no murmurs, rubs or bruits.  Axillary exam shows no bilateral axillary adenopathy.  Abdominal exam is soft.  She is mildly obese.  She has no fluid wave.  There is no palpable abdominal mass.  There is no palpable liver or spleen tip.  I cannot palpate any inguinal adenopathy bilaterally.  Back exam shows no tenderness over the spine, ribs or hips.  Extremities shows no clubbing, cyanosis or edema.  There might be some slight nonpitting edema of the right lower leg.  She has the healing biopsy scar in the back of the right lower leg.  This probably measures about 1-1.5 cm.  There is a nicely formed eschar.  Neurological exam shows no focal neurological deficits.  Skin exam shows no suspicious skin lesions. @IPVITALS @   Recent Labs    01/02/17 1506  WBC 8.9  HGB 12.4  HCT 38.3  PLT 420*   Recent Labs    01/02/17 1506  NA 144  K 4.0  CL 100  CO2 31  GLUCOSE 107  BUN 9  CREATININE 0.9  CALCIUM 9.7    Blood smear review: None  Pathology: See above    Assessment and Plan: [name] [name] is a very nice 71 year old white female.  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  By the clinical evaluation, I would have to say that this is a stage I lesion.  Unfortunately, with Merkel cell tumors, they can be aggressive and can spread quickly.  We will  see what the PET scan shows.  Again I would be surprised if she had any metastatic disease.  I suppose she may have involvement of the right inguinal lymph nodes.  I cannot feel anything on examination.  As far as any type of therapy, this really will be based upon the findings of surgery.  She will need a wide local excision and sentinel node biopsy.  Afterwards, she will need radiation therapy to the primary site and possibly radiation therapy to the nodal basin if there is any positive lymph nodes.  I do not see any role for systemic chemotherapy/immunotherapy unless she has stage IV disease.  Even if she has stage IV disease, I would still utilize aggressive local therapy (i.e. surgery) which I believe will improve her outcome.  I spent a good hour with she and her husband.  I reviewed the pathology report with them.  I reviewed the labs with him.  I answered all their questions.  I gave her a prayer blanket.  She was very thankful for this.  We will plan to get her back after she has definitive surgery for the primary on the right lower leg.

## 2017-01-02 NOTE — Addendum Note (Signed)
Addended by: Burney Gauze R on: 01/02/2017 05:16 PM   Modules accepted: Orders

## 2017-01-03 LAB — LACTATE DEHYDROGENASE: LDH: 512 U/L — ABNORMAL HIGH (ref 125–245)

## 2017-01-09 LAB — NEURON-SPECIFIC ENOLASE(NSE), BLOOD: NEURON-SPECIFIC ENOLASE, SERUM: 104 ng/mL — AB (ref 0.0–12.5)

## 2017-01-10 ENCOUNTER — Other Ambulatory Visit: Payer: Self-pay | Admitting: Family Medicine

## 2017-01-10 NOTE — Telephone Encounter (Signed)
Pt has an acute appt scheduled 01/14/17 and a CPE on 04/30/17, last filled on 12/19/16 #30 tabs with 0 refills, please advise

## 2017-01-10 NOTE — Telephone Encounter (Signed)
Rx called in as prescribed 

## 2017-01-10 NOTE — Telephone Encounter (Signed)
Px written for call in   

## 2017-01-14 ENCOUNTER — Encounter: Payer: Self-pay | Admitting: Family Medicine

## 2017-01-14 ENCOUNTER — Ambulatory Visit: Payer: Medicare Other | Admitting: Family Medicine

## 2017-01-14 VITALS — BP 143/70 | HR 111 | Temp 98.3°F | Ht 65.0 in | Wt 190.2 lb

## 2017-01-14 DIAGNOSIS — G4700 Insomnia, unspecified: Secondary | ICD-10-CM | POA: Diagnosis not present

## 2017-01-14 DIAGNOSIS — C4A9 Merkel cell carcinoma, unspecified: Secondary | ICD-10-CM

## 2017-01-14 DIAGNOSIS — F430 Acute stress reaction: Secondary | ICD-10-CM

## 2017-01-14 DIAGNOSIS — F411 Generalized anxiety disorder: Secondary | ICD-10-CM

## 2017-01-14 MED ORDER — PROMETHAZINE HCL 25 MG PO TABS: 25.0000 mg | ORAL_TABLET | Freq: Three times a day (TID) | ORAL | 0 refills | Status: DC | PRN

## 2017-01-14 MED ORDER — BUSPIRONE HCL 30 MG PO TABS: 15.0000 mg | ORAL_TABLET | Freq: Two times a day (BID) | ORAL | 3 refills | Status: DC

## 2017-01-14 NOTE — Progress Notes (Signed)
Subjective:    Patient ID: [name] [name], female    DOB: April 09, 1945, 71 y.o.   MRN: [id number]  HPI Here for stress related complaints affecting sleep and appetite  Very stressed over cancer diagnosis  She cannot make herself eat (no appetite)-nauseated from stress  Thinks she needs anxiety medicine  Lorrin Mais does help with sleep (will wake up once - but can go back to sleep)  Had a similar experience several years ago with marriage issues    Just adopted her 2 grand kids  4 and 43      Wt Readings from Last 3 Encounters:  01/14/17 190 lb 4 oz (86.3 kg)  01/02/17 199 lb (90.3 kg)  01/24/16 192 lb 4 oz (87.2 kg)  31.66 kg/m  Loosing weight - needed to loose but not like this   Back hurts also / was a little constipated  Spasms - lasted about 3-4 days   Diagnosed with merkel cell skin cancer Stage 1  Has also seen Dr Marin Olp - waiting for ins to approve PET scan (will have this fri)  Dr Lucia Gaskins is her surgeon  [phone] plastic surgeon  Unsure if she will need rad/chemo at this time   Patient Active Problem List   Diagnosis Date Noted  . Anxiety in acute stress reaction 01/14/2017  . 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 Elevated glucose 01/24/2016  . Routine general medical examination at a health care facility 12/28/2014  . Encounter for Medicare annual wellness exam 11/11/2013  . Colon cancer screening 09/18/2011  . Vitamin D deficiency 02/16/2009  . INSOMNIA, CHRONIC 12/14/2008  . VARICOSE VEINS, LOWER EXTREMITIES 12/14/2008  . LEG PAIN, BILATERAL 12/14/2008  . Hyperlipidemia 03/11/2007  . Osteoporosis 03/11/2007  . TOBACCO USE, QUIT 03/11/2007  . OTHER CHRONIC PAIN 02/25/2007  . GERD 02/25/2007   Past Medical History:  Diagnosis Date  . Arthritis   . Depression   . GERD (gastroesophageal reflux disease)   . Hyperlipidemia   . 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 Osteoporosis   . Vitamin D deficiency    Past Surgical History:    Procedure Laterality Date  . ABDOMINAL HYSTERECTOMY    . SHOULDER SURGERY     right   Social History   Tobacco Use  . Smoking status: Former Smoker    Last attempt to quit: 01/22/2005    Years since quitting: 11.9  . Smokeless tobacco: Never Used  Substance Use Topics  . Alcohol use: No    Alcohol/week: 0.0 oz  . Drug use: No   Family History  Problem Relation Age of Onset  . Diabetes Father   . Cancer Father        lung cancer  . Cancer Sister        breast cancer  . Cancer Brother        lung cancer  . Coronary artery disease Paternal Uncle   . Cancer Maternal Grandfather        possible prostate  . Coronary artery disease Paternal Grandfather   . Heart disease Paternal Grandfather        CAD   Allergies  Allergen Reactions  . Evista [Raloxifene Hcl]     cramps  . Fosamax [Alendronate Sodium]     GI intolerance   . Gabapentin     REACTION: nausea  . Nsaids     REACTION: GI upset   Current Outpatient Medications on File Prior to Visit  Medication  Sig Dispense Refill  . Calcium Carb-Cholecalciferol (CALCIUM 600 + D PO) Take 2 tablets by mouth daily.    . cholecalciferol (VITAMIN D) 1000 UNITS tablet Take 5,000 Units by mouth daily.     . traMADol (ULTRAM) 50 MG tablet TAKE 1 TABLET BY MOUTH EVERY 8 HOURS AS NEEDED. 30 tablet 0  . tretinoin (RETIN-A) 0.05 % cream   5  . zolpidem (AMBIEN) 10 MG tablet TAKE 1 TABLET BY MOUTH AT BEDTIME AS NEEDED FOR SLEEP 30 tablet 3   No current facility-administered medications on file prior to visit.     Review of Systems  Constitutional: Positive for fatigue. Negative for activity change, appetite change, fever and unexpected weight change.  HENT: Negative for congestion, ear pain, rhinorrhea, sinus pressure and sore throat.   Eyes: Negative for pain, redness and visual disturbance.  Respiratory: Negative for cough, shortness of breath and wheezing.   Cardiovascular: Negative for chest pain and palpitations.   Gastrointestinal: Negative for abdominal pain, blood in stool, constipation and diarrhea.  Endocrine: Negative for polydipsia and polyuria.  Genitourinary: Negative for dysuria, frequency and urgency.  Musculoskeletal: Negative for arthralgias, back pain and myalgias.  Skin: Negative for pallor and rash.  Allergic/Immunologic: Negative for environmental allergies.  Neurological: Negative for dizziness, syncope and headaches.  Hematological: Negative for adenopathy. Does not bruise/bleed easily.  Psychiatric/Behavioral: Positive for decreased concentration, dysphoric mood and sleep disturbance. Negative for confusion and suicidal ideas. The patient is nervous/anxious.        Objective:   Physical Exam  Constitutional: She appears well-developed and well-nourished. No distress.  overwt and anxious appearing   HENT:  Head: Normocephalic and atraumatic.  Mouth/Throat: Oropharynx is clear and moist.  Eyes: Conjunctivae and EOM are normal. Pupils are equal, round, and reactive to light.  Neck: Normal range of motion. Neck supple. No JVD present. Carotid bruit is not present. No thyromegaly present.  Cardiovascular: Normal rate, regular rhythm, normal heart sounds and intact distal pulses. Exam reveals no gallop.  Pulmonary/Chest: Effort normal and breath sounds normal. No respiratory distress. She has no wheezes. She has no rales.  No crackles  Abdominal: Soft. Bowel sounds are normal. She exhibits no distension, no abdominal bruit and no mass. There is no tenderness.  Musculoskeletal: She exhibits no edema.  Lymphadenopathy:    She has no cervical adenopathy.  Neurological: She is alert. She has normal reflexes. She displays tremor. No cranial nerve deficit. She exhibits normal muscle tone. Coordination normal.  Mild anxious hand tremor   Skin: Skin is warm and dry. No rash noted.  Raised round dry/cracked skin lesion post R calf   Psychiatric: Her speech is normal and behavior is  normal. Her mood appears anxious. Her affect is not blunt, not labile and not inappropriate. Thought content is not paranoid. Cognition and memory are normal. She exhibits a depressed mood. She expresses no homicidal and no suicidal ideation.  Anxious  Talks freely about stressors  Not tearful           Assessment & Plan:   Problem List Items Addressed This Visit      Musculoskeletal and Integument   Merkel cell carcinoma ([place])    Of the leg  For surgery/reconstruction soon  Suspect stage 1  May need chemo/radiation  Followed by surgery/oncol and plastic surg        Other   Anxiety in acute stress reaction - Primary    With nausea/sleep problems  Multifactorial- with recent cancer diagnosis a  major cause  Reviewed stressors/ coping techniques/symptoms/ support sources/ tx options and side effects in detail today  No SI or thoughts of self harm Disc opt for tx Will try buspar 15 bid (can titrate up if needed) Discussed expectations of this medication including time to effectiveness and mechanism of action, also poss of side effects (early and late)- including mental fuzziness, weight or appetite change, nausea and poss of worse dep or anxiety (even suicidal thoughts)  Pt voiced understanding and will stop med and update if this occurs   Continue ambien for sleep as needed >25 minutes spent in face to face time with patient, >50% spent in counselling or coordination of care -including need for self care /exercise/outdoor time  Offered counseling ref -pt declined Disc value of talking/ use of support / and meditation  F/u planned  Px phenergan additionally for nausea (warning of sedation)       Relevant Medications   busPIRone (BUSPAR) 30 MG tablet   INSOMNIA, CHRONIC    Worsened by recent stressors ambien is helpful Disc proper way to take  Disc sleep hygiene  tx anxiety with buspar  Meditation is also enc

## 2017-01-14 NOTE — Patient Instructions (Addendum)
Drink lots of fluids  Water/ginger ale are good  Try the phenergan (with caution of sedation) for nausea  Take buspar 15 mg (1/2 of at 30 mg tab) - twice daily in am and pm for anxiety  If any problems or side effects / or if you feel worse please call  If it works well we can increase dose- let us know   If you can get a book or CD on meditation -it helps tremendously   Follow up with [place] in about 1 month (work around other appointments if fine)  If you want to see a counselor please let me know   Write in a journal  Talk to friends and family - this helps a lot also    Good luck with your treatment!

## 2017-01-15 NOTE — Assessment & Plan Note (Signed)
Worsened by recent stressors [name] [name] is helpful Disc proper way to take  Disc sleep hygiene  tx anxiety with buspar  Meditation is also enc

## 2017-01-15 NOTE — Assessment & Plan Note (Signed)
Of the leg  For surgery/reconstruction soon  Suspect stage 1  May need chemo/radiation  Followed by surgery/oncol and plastic surg

## 2017-01-15 NOTE — Assessment & Plan Note (Signed)
With nausea/sleep problems  Multifactorial- with recent cancer diagnosis a major cause  Reviewed stressors/ coping techniques/symptoms/ support sources/ tx options and side effects in detail today  No SI or thoughts of self harm Disc opt for tx Will try buspar 15 bid (can titrate up if needed) Discussed expectations of this medication including time to effectiveness and mechanism of action, also poss of side effects (early and late)- including mental fuzziness, weight or appetite change, nausea and poss of worse dep or anxiety (even suicidal thoughts)  Pt voiced understanding and will stop med and update if this occurs   Continue ambien for sleep as needed >25 minutes spent in face to face time with patient, >50% spent in counselling or coordination of care -including need for self care /exercise/outdoor time  Offered counseling ref -pt declined Disc value of talking/ use of support / and meditation  F/u planned  Px phenergan additionally for nausea (warning of sedation)

## 2017-01-16 ENCOUNTER — Encounter (HOSPITAL_COMMUNITY)
Admission: RE | Admit: 2017-01-16 | Discharge: 2017-01-16 | Disposition: A | Discharge status: Home / Self Care | Payer: Medicare Other | Source: Ambulatory Visit | Attending: Surgery | Admitting: Surgery

## 2017-01-16 DIAGNOSIS — C4A71 Merkel cell carcinoma of right lower limb, including hip: Secondary | ICD-10-CM | POA: Insufficient documentation

## 2017-01-16 MED ORDER — TECHNETIUM TC 99M SULFUR COLLOID FILTERED
0.5000 | Freq: Once | INTRAVENOUS | Status: AC | PRN
  Administered 2017-01-16: 0.5 via INTRADERMAL

## 2017-01-18 ENCOUNTER — Telehealth: Payer: Self-pay | Admitting: Family Medicine

## 2017-01-18 MED ORDER — TIZANIDINE HCL 4 MG PO TABS: 4.0000 mg | ORAL_TABLET | Freq: Four times a day (QID) | ORAL | 1 refills | Status: AC | PRN

## 2017-01-18 NOTE — Telephone Encounter (Signed)
It sounds like she is worse and we need to get her back to ortho -I'm happy to do an urgent referral-please let me know   I cannot px anything narcotic  - but perhaps a pain clinic could help her out if ortho does not have a better strategy   If she would like a muscle relaxer- I can to that -just let me know if she wants one and which pharmacy

## 2017-01-18 NOTE — Telephone Encounter (Signed)
Please get some more details about back pain / spasms or other? Other meds tried? Is she seeing a specialist? Last imaging was when?   Thanks  For bowels-start miralax otc (or store brand)- use twice daily until bowels start moving regularly then go down to once daily or as needed  If no improvement alert me   Of note= most pain medications also cause constipation which is unfortunate

## 2017-01-18 NOTE — Telephone Encounter (Signed)
Pt notified of Dr. Marliss Coots comments pt said she is dealing with a lot of doc appt and issues right now due to cancer on leg, pt declined for [place] to refer her to an ortho doc right now but she said if she is still dealing with this back pain after things "settle down" then she will call us back but in the mean time she does want to try a muscle relaxer, pt uses [facility]

## 2017-01-18 NOTE — Telephone Encounter (Signed)
Copied from [place] [id number]. Topic: Quick Communication - See Telephone Encounter >> Jan 18, 2017 10:48 AM Bea Graff, NT wrote: CRM for notification. See Telephone encounter for: Pt requesting a medication to help her bowels move and something for back pain. The tramadol is not working. [facility] on [address]  01/18/17.

## 2017-01-18 NOTE — Telephone Encounter (Addendum)
Pt notified about what to do for her bowels and she will get the miralax an start taking it BID.  Pt said that she has seen an Ortho Writer and a chiropractor years ago for her back issues. She has had nerve blocks too. The surgeon said that if he did surgery that it probably wouldn't help because she has so much going on with her back. So she has been just dealing with the pain but when she had to have the PET scan a few days ago the dye wouldn't circulate so she had to get up and down off the table 5x before it worked and pt said all the up and down and having the tech help "pull" her up has made her back flare up. Pt said her back hurts so bad she can't even lay down or move much without pain. Pt said that the tramadol isn't helping at all but she did have a hydrocodone from years ago and she took it and it did help some

## 2017-01-18 NOTE — Telephone Encounter (Signed)
I sent zanaflex (generic)  Hope it helps

## 2017-01-19 ENCOUNTER — Other Ambulatory Visit: Payer: Self-pay

## 2017-01-19 ENCOUNTER — Encounter: Payer: Self-pay | Admitting: Emergency Medicine

## 2017-01-19 ENCOUNTER — Emergency Department
Admission: EM | Admit: 2017-01-19 | Discharge: 2017-01-19 | Disposition: A | Discharge status: Home / Self Care | Payer: Medicare Other | Attending: Student in an Organized Health Care Education/Training Program | Admitting: Student in an Organized Health Care Education/Training Program

## 2017-01-19 DIAGNOSIS — Z79899 Other long term (current) drug therapy: Secondary | ICD-10-CM | POA: Insufficient documentation

## 2017-01-19 DIAGNOSIS — C4A30 Merkel cell carcinoma of unspecified part of face: Secondary | ICD-10-CM | POA: Insufficient documentation

## 2017-01-19 DIAGNOSIS — Z87891 Personal history of nicotine dependence: Secondary | ICD-10-CM | POA: Insufficient documentation

## 2017-01-19 DIAGNOSIS — M5431 Sciatica, right side: Secondary | ICD-10-CM | POA: Diagnosis not present

## 2017-01-19 DIAGNOSIS — M549 Dorsalgia, unspecified: Secondary | ICD-10-CM | POA: Diagnosis present

## 2017-01-19 MED ORDER — OXYCODONE HCL 5 MG PO TABS
5.0000 mg | ORAL_TABLET | Freq: Once | ORAL | Status: AC
  Administered 2017-01-19: 5 mg via ORAL
  Filled 2017-01-19: qty 1

## 2017-01-19 MED ORDER — OXYCODONE-ACETAMINOPHEN 5-325 MG PO TABS: 1.0000 | ORAL_TABLET | ORAL | 0 refills | Status: DC | PRN

## 2017-01-19 MED ORDER — METHYLPREDNISOLONE SODIUM SUCC 125 MG IJ SOLR
125.0000 mg | Freq: Once | INTRAMUSCULAR | Status: AC
  Administered 2017-01-19: 125 mg via INTRAMUSCULAR
  Filled 2017-01-19: qty 2

## 2017-01-19 MED ORDER — OXYCODONE HCL 5 MG PO TABS: 5.0000 mg | ORAL_TABLET | Freq: Once | ORAL | Status: DC

## 2017-01-19 MED ORDER — PREDNISONE 10 MG (21) PO TBPK: ORAL_TABLET | ORAL | 0 refills | Status: DC

## 2017-01-19 NOTE — ED Notes (Signed)
Pt has lower back pain radiaiting into right lower leg to the foot.    No known injury.  Denies urinary sx,.  Pt alert.  Family with pt.

## 2017-01-19 NOTE — ED Triage Notes (Signed)
Low back pan radiating to R leg x 1 week. History of low back pain. No new injury.

## 2017-01-19 NOTE — Discharge Instructions (Signed)
Please follow up with your primary care provider or orthopedics for symptoms that are not improving over the next week or so. Return to the ER for symptoms that change or worsen if unable to schedule an appointment.

## 2017-01-19 NOTE — ED Provider Notes (Signed)
[facility] Emergency Department Provider Note ____________________________________________  Time seen: Approximately 3:02 PM  I have reviewed the triage vital signs and the nursing notes.   HISTORY  Chief Complaint Back Pain    HPI [name] [name] is a 71 y.o. female who presents to the emergency department for evaluation of back pain.  Patient states that she has a long-standing history of multiple back issues including bulging and slipped disks, degenerative disc disease, and mild scoliosis.  She states that she was recently diagnosed with Merkel cell carcinoma and has had to undergo multiple MRIs and other tests which have required her to lie flat on a hard surface.  She feels that this has been the trigger of her current back pain.  She denies any recent injury.  She denies changes in bowel or bladder habits or loss of bladder control.  She has taken tizanidine and to Norco for this back pain without any relief.  Past Medical History:  Diagnosis Date  . Arthritis   . Depression   . GERD (gastroesophageal reflux disease)   . Hyperlipidemia   . 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 Osteoporosis   . Vitamin D deficiency     Patient Active Problem List   Diagnosis Date Noted  . Anxiety in acute stress reaction 01/14/2017  . Merkel cell carcinoma ([place]) 01/02/2017  . Elevated glucose 01/24/2016  . Routine general medical examination at a health care facility 12/28/2014  . Encounter for Medicare annual wellness exam 11/11/2013  . Colon cancer screening 09/18/2011  . Vitamin D deficiency 02/16/2009  . INSOMNIA, CHRONIC 12/14/2008  . VARICOSE VEINS, LOWER EXTREMITIES 12/14/2008  . LEG PAIN, BILATERAL 12/14/2008  . Hyperlipidemia 03/11/2007  . Osteoporosis 03/11/2007  . TOBACCO USE, QUIT 03/11/2007  . OTHER CHRONIC PAIN 02/25/2007  . GERD 02/25/2007    Past Surgical History:  Procedure Laterality Date  . ABDOMINAL HYSTERECTOMY    .  SHOULDER SURGERY     right    Prior to Admission medications   Medication Sig Start Date End Date Taking? Authorizing Provider  busPIRone (BUSPAR) 30 MG tablet Take 0.5 tablets (15 mg total) by mouth 2 (two) times daily. 01/14/17   Tower, Wynelle Fanny, MD  Calcium Carb-Cholecalciferol (CALCIUM 600 + D PO) Take 2 tablets by mouth daily.    [provider]  cholecalciferol (VITAMIN D) 1000 UNITS tablet Take 5,000 Units by mouth daily.     [provider]  oxyCODONE-acetaminophen (ROXICET) 5-325 MG tablet Take 1 tablet by mouth every 4 (four) hours as needed for severe pain. 01/19/17   Shandiin Eisenbeis B, FNP  predniSONE (STERAPRED UNI-PAK 21 TAB) 10 MG (21) TBPK tablet Take 6 tablets on day 1 Take 5 tablets on day 2 Take 4 tablets on day 3 Take 3 tablets on day 4 Take 2 tablets on day 5 Take 1 tablet on day 6 01/19/17   Hydee Fleece B, FNP  promethazine (PHENERGAN) 25 MG tablet Take 1 tablet (25 mg total) by mouth every 8 (eight) hours as needed for nausea or vomiting. 01/14/17   Tower, Wynelle Fanny, MD  tiZANidine (ZANAFLEX) 4 MG tablet Take 1 tablet (4 mg total) by mouth every 6 (six) hours as needed for muscle spasms. Caution of sedation 01/18/17   Tower, Wynelle Fanny, MD  traMADol (ULTRAM) 50 MG tablet TAKE 1 TABLET BY MOUTH EVERY 8 HOURS AS NEEDED. 01/10/17   Tower, Wynelle Fanny, MD  tretinoin (RETIN-A) 0.05 % cream  10/31/13   [provider]  zolpidem (AMBIEN) 10 MG tablet TAKE 1 TABLET BY MOUTH AT BEDTIME AS NEEDED FOR SLEEP 11/23/16   Tower, Wynelle Fanny, MD    Allergies Evista [raloxifene hcl]; Fosamax [alendronate sodium]; Gabapentin; and Nsaids  Family History  Problem Relation Age of Onset  . Diabetes Father   . Cancer Father        lung cancer  . Cancer Sister        breast cancer  . Cancer Brother        lung cancer  . Coronary artery disease Paternal Uncle   . Cancer Maternal Grandfather        possible prostate  . Coronary artery disease Paternal Grandfather   .  Heart disease Paternal Grandfather        CAD    Social History Social History   Tobacco Use  . Smoking status: Former Smoker    Last attempt to quit: 01/22/2005    Years since quitting: 12.0  . Smokeless tobacco: Never Used  Substance Use Topics  . Alcohol use: No    Alcohol/week: 0.0 oz  . Drug use: No    Review of Systems Constitutional: Well appearing. Cardiovascular: Negative for change in skin temperature or color. Respiratory: Negative for dyspnea. Musculoskeletal:   Negative for fecal incontinence,  Saddle anesthesia, or urinary retention  Negative for immunosuppression, IV drug use, or fever  Negative for chronic steroid use   Negative for trauma in the presence of osteoporosis  Negative for age over 59 and trauma.  Positive for history of Merkel cell carcinoma  Negative for pain worse at night.  Negative for focal neurologic deficit, progressive, or disabling symptoms Skin: Negative for rash, lesion, or wound.  Neurological: Positive for burning, tingling, numb, electric, radiating pain in the right lower back with radiation into the right foot.  ____________________________________________   PHYSICAL EXAM:  VITAL SIGNS: ED Triage Vitals  Enc Vitals Group     BP 01/19/17 1358 (!) 124/99     Pulse Rate 01/19/17 1358 92     Resp 01/19/17 1358 18     Temp 01/19/17 1358 97.7 F (36.5 C)     Temp Source 01/19/17 1358 Oral     SpO2 01/19/17 1358 97 %     Weight 01/19/17 1359 190 lb (86.2 kg)     Height 01/19/17 1359 5\' 5"  (1.651 m)     Head Circumference --      Peak Flow --      Pain Score 01/19/17 1358 10     Pain Loc --      Pain Edu? --      Excl. in [place]? --     Constitutional: Alert and oriented. Well appearing and in no acute distress. Eyes: Conjunctivae are clear without discharge or drainage.  Head: Atraumatic. Neck: Full, active range of motion. Respiratory: Respirations even and unlabored. Musculoskeletal: Full ROM, Strength 4/5 of the lower  extremities as tested. Neurologic: Reflexes of the lower extremities are 2+.  Positive straight leg raise on the right side at 35 degrees. Skin: Atraumatic.  Psychiatric: Behavior and affect are normal.  ____________________________________________   LABS (all labs ordered are listed, but only abnormal results are displayed)  Labs Reviewed - No data to display ____________________________________________  RADIOLOGY  Not indicated ____________________________________________   PROCEDURES  Procedure(s) performed:  Procedures ____________________________________________   INITIAL IMPRESSION / ASSESSMENT AND PLAN / ED COURSE  Malon P Utke is a 71 y.o. female  who presents to the emergency department for evaluation and treatment of back pain.  Patient has a chronic history of back pain, but states that she has not had a flareup of this intensity for the past several years.  She feels that this has been instigated by having to move on and off the MRI table for testing related to Merkel cell carcinoma.  While in the emergency department tonight, she was given an injection of Solu-Medrol and an immediate release oxycodone with significant improvement.  She will be discharged home with a prescription for prednisone and Roxicet.  She was instructed to follow-up with her primary care provider or orthopedics for symptoms that did not resolve over the next several days.  She was advised to return to the emergency department for symptoms of change or worsen if she is unable to schedule an appointment.  Medications  methylPREDNISolone sodium succinate (SOLU-MEDROL) 125 mg/2 mL injection 125 mg (125 mg Intramuscular Given 01/19/17 1603)  oxyCODONE (Oxy IR/ROXICODONE) immediate release tablet 5 mg (5 mg Oral Given 01/19/17 1603)    ED Discharge Orders        Ordered    predniSONE (STERAPRED UNI-PAK 21 TAB) 10 MG (21) TBPK tablet     01/19/17 1643    oxyCODONE-acetaminophen (ROXICET) 5-325 MG  tablet  Every 4 hours PRN     01/19/17 1646       Pertinent labs & imaging results that were available during my care of the patient were reviewed by me and considered in my medical decision making (see chart for details).   _________________________________________   FINAL CLINICAL IMPRESSION(S) / ED DIAGNOSES  Final diagnoses:  Sciatica of right side     If controlled substance prescribed during this visit, 12 month history viewed on the [place] prior to issuing an initial prescription for Schedule II or III opiod.    Victorino Dike, FNP 01/19/17 2320    Merlyn Lot, MD 01/20/17 [phone]

## 2017-01-21 ENCOUNTER — Other Ambulatory Visit: Payer: Self-pay | Admitting: *Deleted

## 2017-01-21 ENCOUNTER — Telehealth: Payer: Self-pay | Admitting: Family Medicine

## 2017-01-21 ENCOUNTER — Telehealth: Payer: Self-pay | Admitting: *Deleted

## 2017-01-21 MED ORDER — LACTULOSE 10 GM/15ML PO SOLN: 10.0000 g | ORAL | 0 refills | Status: AC | PRN

## 2017-01-21 NOTE — Telephone Encounter (Signed)
Called pt and pt's spouse answered and said "we were suppose to take care of this 3 hrs ago, we are incompetent morons that function like we have 2 left shoes and Dr. Glori Bickers can kiss his a** (curse word)" he said pt's oncologist took care of her needs so he "doesn't want anything else from [place]"

## 2017-01-21 NOTE — Telephone Encounter (Signed)
Any nausea or vomiting (just to r/o bowel obstruction) or fever?

## 2017-01-21 NOTE — Telephone Encounter (Signed)
I will discuss this with the pt the next time she is here if she decides to remain a patient here.   Unsure if she is aware how husband spoke to our staff abusively.

## 2017-01-21 NOTE — Telephone Encounter (Signed)
Patient called this past Friday requesting medication for her scheduled PET scan on Thursday. She was unable to complete scan today due to back pain.  Reviewed chart with Dr Marin Olp. Patient was seen in the ED over the weekend for back pain. She was prescribed prednisone and percocet for pain. He stated patient can take prescribed percocet for pain control.  Called and spoke to patient husband. He states patient's back pain is considerably better on medication regimen, however she is now having significant issues with constipation. They have been in touch with the PCP and are awaiting a response.  Husband instructed to call our office back later today if they don't hear back from PCP office. Also requested to call our office on Wednesday if they feel patient's back pain is not controlled enough to where patient can tolerate scan. He agrees to both.

## 2017-01-21 NOTE — Telephone Encounter (Signed)
Copied from [place] [id number]. Topic: Quick Communication - See Telephone Encounter >> Jan 21, 2017  9:12 AM Bea Graff, NT wrote: CRM for notification. See Telephone encounter for: Pt has tried several fleet enemas, miralax twice a day and still unable to use the restroom. She is needing something called in to help her use the restroom. She is in miserable pain. [employer].   01/21/17.

## 2017-01-28 ENCOUNTER — Other Ambulatory Visit: Payer: Self-pay | Admitting: *Deleted

## 2017-01-28 MED ORDER — OXYCODONE-ACETAMINOPHEN 5-325 MG PO TABS: 1.0000 | ORAL_TABLET | ORAL | 0 refills | Status: DC | PRN

## 2017-01-30 ENCOUNTER — Ambulatory Visit
Admission: RE | Admit: 2017-01-30 | Discharge: 2017-01-30 | Disposition: A | Discharge status: Home / Self Care | Payer: Medicare Other | Source: Ambulatory Visit | Attending: Surgery | Admitting: Surgery

## 2017-01-30 DIAGNOSIS — R600 Localized edema: Secondary | ICD-10-CM | POA: Insufficient documentation

## 2017-01-30 DIAGNOSIS — R590 Localized enlarged lymph nodes: Secondary | ICD-10-CM | POA: Insufficient documentation

## 2017-01-30 DIAGNOSIS — C4A71 Merkel cell carcinoma of right lower limb, including hip: Secondary | ICD-10-CM | POA: Insufficient documentation

## 2017-01-30 DIAGNOSIS — C787 Secondary malignant neoplasm of liver and intrahepatic bile duct: Secondary | ICD-10-CM | POA: Diagnosis not present

## 2017-01-30 DIAGNOSIS — I700 Atherosclerosis of aorta: Secondary | ICD-10-CM | POA: Insufficient documentation

## 2017-01-30 DIAGNOSIS — C7951 Secondary malignant neoplasm of bone: Secondary | ICD-10-CM | POA: Insufficient documentation

## 2017-01-30 LAB — GLUCOSE, CAPILLARY: GLUCOSE-CAPILLARY: 111 mg/dL — AB (ref 65–99)

## 2017-01-30 MED ORDER — FLUDEOXYGLUCOSE F - 18 (FDG) INJECTION
12.8100 | Freq: Once | INTRAVENOUS | Status: AC | PRN
  Administered 2017-01-30: 12.81 via INTRAVENOUS

## 2017-01-31 ENCOUNTER — Ambulatory Visit: Payer: Medicare Other | Admitting: Hematology & Oncology

## 2017-01-31 ENCOUNTER — Ambulatory Visit: Payer: Medicare Other

## 2017-02-01 ENCOUNTER — Other Ambulatory Visit: Payer: Self-pay

## 2017-02-01 ENCOUNTER — Encounter: Payer: Self-pay | Admitting: Hematology & Oncology

## 2017-02-01 ENCOUNTER — Inpatient Hospital Stay (HOSPITAL_BASED_OUTPATIENT_CLINIC_OR_DEPARTMENT_OTHER): Payer: Medicare Other | Admitting: Hematology & Oncology

## 2017-02-01 ENCOUNTER — Inpatient Hospital Stay: Payer: Medicare Other | Attending: Hematology & Oncology

## 2017-02-01 VITALS — BP 111/69 | HR 110 | Temp 98.1°F | Resp 18 | Wt 189.0 lb

## 2017-02-01 DIAGNOSIS — C787 Secondary malignant neoplasm of liver and intrahepatic bile duct: Secondary | ICD-10-CM | POA: Insufficient documentation

## 2017-02-01 DIAGNOSIS — C7951 Secondary malignant neoplasm of bone: Secondary | ICD-10-CM

## 2017-02-01 DIAGNOSIS — C4A9 Merkel cell carcinoma, unspecified: Secondary | ICD-10-CM

## 2017-02-01 DIAGNOSIS — C7800 Secondary malignant neoplasm of unspecified lung: Secondary | ICD-10-CM

## 2017-02-01 DIAGNOSIS — K5900 Constipation, unspecified: Secondary | ICD-10-CM | POA: Insufficient documentation

## 2017-02-01 DIAGNOSIS — C4A30 Merkel cell carcinoma of unspecified part of face: Secondary | ICD-10-CM

## 2017-02-01 DIAGNOSIS — C779 Secondary and unspecified malignant neoplasm of lymph node, unspecified: Secondary | ICD-10-CM

## 2017-02-01 DIAGNOSIS — Z79899 Other long term (current) drug therapy: Secondary | ICD-10-CM

## 2017-02-01 DIAGNOSIS — R21 Rash and other nonspecific skin eruption: Secondary | ICD-10-CM | POA: Diagnosis not present

## 2017-02-01 DIAGNOSIS — R600 Localized edema: Secondary | ICD-10-CM | POA: Diagnosis not present

## 2017-02-01 LAB — CMP (CANCER CENTER ONLY)
ALT: 24 U/L (ref 0–55)
ANION GAP: 11 (ref 3–11)
AST: 79 U/L — ABNORMAL HIGH (ref 5–34)
Albumin: 2.3 g/dL — ABNORMAL LOW (ref 3.5–5.0)
Alkaline Phosphatase: 493 U/L — ABNORMAL HIGH (ref 40–150)
BUN: 28 mg/dL — ABNORMAL HIGH (ref 7–26)
CHLORIDE: 93 mmol/L — AB (ref 98–109)
CO2: 29 mmol/L (ref 22–29)
Calcium: 11.2 mg/dL — ABNORMAL HIGH (ref 8.4–10.4)
Creatinine: 0.95 mg/dL (ref 0.60–1.10)
GFR, EST NON AFRICAN AMERICAN: 59 mL/min — AB (ref >60)
Glucose, Bld: 116 mg/dL (ref 70–140)
POTASSIUM: 4.5 mmol/L (ref 3.3–4.7)
Sodium: 133 mmol/L — ABNORMAL LOW (ref 136–145)
Total Bilirubin: 0.7 mg/dL (ref 0.2–1.2)
Total Protein: 6.8 g/dL (ref 6.4–8.3)

## 2017-02-01 LAB — LACTATE DEHYDROGENASE: LDH: 2331 U/L — AB (ref 125–245)

## 2017-02-01 MED ORDER — MEGESTROL ACETATE 400 MG/10ML PO SUSP: 400.0000 mg | Freq: Two times a day (BID) | ORAL | 3 refills | Status: AC

## 2017-02-01 MED ORDER — OXYCODONE ER 13.5 MG PO C12A: 13.5000 mg | EXTENDED_RELEASE_CAPSULE | Freq: Two times a day (BID) | ORAL | 0 refills | Status: AC

## 2017-02-01 MED ORDER — OXYCODONE HCL 10 MG PO TABS: 10.0000 mg | ORAL_TABLET | Freq: Four times a day (QID) | ORAL | 0 refills | Status: AC | PRN

## 2017-02-01 NOTE — Progress Notes (Addendum)
Hematology and Oncology Follow Up Visit  [name] [name] [id number] 1945/12/09 72 y.o. 02/01/2017   Principle Diagnosis:   Metastatic Merkel cell carcinoma-lung, lymph node, and bone metastasis  Current Therapy:    Avelumab - cycle #1 to start on 02/07/2017  Xgeva 120 mg SQ q 3 months     Interim History:  [name] [name] is back for second office visit.  We first saw her a few weeks ago.  Unfortunately, she clearly has metastatic disease.  She finally had a PET scan done.  This was done about 2 or 3 days ago.  The PET scan shows that she has metastatic disease to multiple lymph nodes, lung, multiple bones.  Also noted was disease in her liver.  She is really not eating all that much.  She comes in in a wheelchair today.  She is having back issues.  The back problems might be from her metastatic disease.  She is on Percocet for this.  I think given her extent of disease, she probably needs to be on a long-acting pain medication.  She has had no cough or shortness of breath.  There is no bleeding.  She has had some constipation.  He has had a little bit of leg swelling.  There is no fever.  She has had no headache.  Currently, her performance status is ECOG 2-3.  Medications:  Current Outpatient Medications:  .  busPIRone (BUSPAR) 30 MG tablet, Take 0.5 tablets (15 mg total) by mouth 2 (two) times daily., Disp: 30 tablet, Rfl: 3 .  Calcium Carb-Cholecalciferol (CALCIUM 600 + D PO), Take 2 tablets by mouth daily., Disp: , Rfl:  .  cholecalciferol (VITAMIN D) 1000 UNITS tablet, Take 5,000 Units by mouth daily. , Disp: , Rfl:  .  ENULOSE 10 GM/15ML SOLN, , Disp: , Rfl: 0 .  lactulose (CHRONULAC) 10 GM/15ML solution, Take 15 mLs (10 g total) by mouth every 4 (four) hours as needed for severe constipation., Disp: 240 mL, Rfl: 0 .  megestrol (MEGACE) 400 MG/10ML suspension, Take 10 mLs (400 mg total) by mouth 2 (two) times daily., Disp: 480 mL, Rfl: 3 .  oxyCODONE 10 MG TABS, Take 1  tablet (10 mg total) by mouth every 6 (six) hours as needed for severe pain., Disp: 90 tablet, Rfl: 0 .  OxyCODONE ER (XTAMPZA ER) 13.5 MG C12A, Take 13.5 mg by mouth every 12 (twelve) hours., Disp: 60 each, Rfl: 0 .  predniSONE (STERAPRED UNI-PAK 21 TAB) 10 MG (21) TBPK tablet, Take 6 tablets on day 1 Take 5 tablets on day 2 Take 4 tablets on day 3 Take 3 tablets on day 4 Take 2 tablets on day 5 Take 1 tablet on day 6, Disp: 21 tablet, Rfl: 0 .  promethazine (PHENERGAN) 25 MG tablet, Take 1 tablet (25 mg total) by mouth every 8 (eight) hours as needed for nausea or vomiting., Disp: 20 tablet, Rfl: 0 .  tiZANidine (ZANAFLEX) 4 MG tablet, Take 1 tablet (4 mg total) by mouth every 6 (six) hours as needed for muscle spasms. Caution of sedation, Disp: 30 tablet, Rfl: 1 .  traMADol (ULTRAM) 50 MG tablet, TAKE 1 TABLET BY MOUTH EVERY 8 HOURS AS NEEDED., Disp: 30 tablet, Rfl: 0 .  tretinoin (RETIN-A) 0.05 % cream, , Disp: , Rfl: 5 .  zolpidem (AMBIEN) 10 MG tablet, TAKE 1 TABLET BY MOUTH AT BEDTIME AS NEEDED FOR SLEEP, Disp: 30 tablet, Rfl: 3  Allergies:  Allergies  Allergen Reactions  .  Evista [Raloxifene Hcl]     cramps  . Fosamax [Alendronate Sodium]     GI intolerance   . Gabapentin     REACTION: nausea  . Nsaids     REACTION: GI upset    Past Medical History, Surgical history, Social history, and Family History were reviewed and updated.  Review of Systems: Review of Systems  Constitutional: Positive for fatigue and unexpected weight change.  HENT:  Negative.   Eyes: Negative.   Respiratory: Negative.   Cardiovascular: Positive for leg swelling.  Gastrointestinal: Positive for constipation.  Endocrine: Negative.   Genitourinary: Negative.    Musculoskeletal: Positive for back pain.  Skin: Negative.   Neurological: Negative.   Hematological: Negative.   Psychiatric/Behavioral: Negative.     Physical Exam:  weight is 189 lb (85.7 kg). Her oral temperature is 98.1 F (36.7 C).  Her blood pressure is 111/69 and her pulse is 110 (abnormal). Her respiration is 18 and oxygen saturation is 95%.   Wt Readings from Last 3 Encounters:  02/01/17 189 lb (85.7 kg)  01/30/17 186 lb (84.4 kg)  01/19/17 190 lb (86.2 kg)    Physical Exam  Constitutional: She is oriented to person, place, and time.  HENT:  Head: Normocephalic and atraumatic.  Mouth/Throat: Oropharynx is clear and moist.  Eyes: EOM are normal. Pupils are equal, round, and reactive to light.  Neck: Normal range of motion.  Cardiovascular: Normal rate, regular rhythm and normal heart sounds.  Pulmonary/Chest: Effort normal and breath sounds normal.  Abdominal: Soft. Bowel sounds are normal.  Musculoskeletal: Normal range of motion. She exhibits no edema, tenderness or deformity.  Lymphadenopathy:    She has no cervical adenopathy.  Neurological: She is alert and oriented to person, place, and time.  Skin: Skin is warm and dry. No rash noted. No erythema.  Psychiatric: She has a normal mood and affect. Her behavior is normal. Judgment and thought content normal.  Vitals reviewed.    Lab Results  Component Value Date   WBC 8.9 01/02/2017   HGB 12.4 01/02/2017   HCT 32.4 (L) 02/01/2017   MCV 89.5 02/01/2017   PLT 420 (H) 01/02/2017     Chemistry      Component Value Date/Time   NA 133 (L) 02/01/2017 0922   NA 144 01/02/2017 1506   K 4.5 02/01/2017 0922   K 4.0 01/02/2017 1506   CL 93 (L) 02/01/2017 0922   CL 100 01/02/2017 1506   CO2 29 02/01/2017 0922   CO2 31 01/02/2017 1506   BUN 28 (H) 02/01/2017 0922   BUN 9 01/02/2017 1506   CREATININE 0.9 01/02/2017 1506      Component Value Date/Time   CALCIUM 11.2 (H) 02/01/2017 0922   CALCIUM 9.7 01/02/2017 1506   ALKPHOS 493 (H) 02/01/2017 0922   ALKPHOS 86 (H) 01/02/2017 1506   AST 79 (H) 02/01/2017 0922   ALT 24 02/01/2017 0922   ALT 23 01/02/2017 1506   BILITOT 0.7 02/01/2017 0922         Impression and Plan: [name] [name] is a  73 year old white female.  She has metastatic Merkel cell carcinoma.  This is clearly an aggressive process.  Thankfully, we do have the new immunotherapy agent-Avelumab-which the FDA has approved for front line therapy.  I think this would be the best option for Ms. Stroope given her performance status.  I spent about 45 minutes with she and her family.  There are all very nice.  I am glad  that she has had a good support system.  Clearly, I told them that this is a disease that we can treat but would that we cannot cure.  As such, her goal of care needs to be her quality of life and her being functional.  We really need to try to work on her appetite.  I gave her a prescription for Megace elixir (400 mg p.o. twice daily) to try to help with her appetite.  As far as her pain medication, we will have to try her on long-acting oxycodone.  I gave her a prescription for Xtampza (13.5mg  po BID) to see if this can help.  She also got oxycodone (10 mg p.o. every 6 hours as needed) to help with exacerbations of pain.  She will need a Port-A-Cath.  I gave them information sheets about the Avelumab.  I explained the side effects.  I went over the issues that can happen with the liver, lungs, skin and diarrhea.    I answered all their questions.  We will try to get started next week.  I would like to try to give her 4 cycles of treatment and then reassess her with another PET scan   Volanda Napoleon, MD 1/11/20193:00 PM

## 2017-02-01 NOTE — Progress Notes (Signed)
START OFF PATHWAY REGIMEN [name] [name] Dx]   [id number]:Avelumab 10 mg/kg q14 days:   A cycle is every 14 days:     Avelumab   **Always confirm dose/schedule in your pharmacy ordering system**    Patient Characteristics: Intent of Therapy: Non-Curative / Palliative Intent, Discussed with Patient

## 2017-02-04 ENCOUNTER — Inpatient Hospital Stay: Payer: Medicare Other

## 2017-02-04 ENCOUNTER — Other Ambulatory Visit: Payer: Self-pay

## 2017-02-04 ENCOUNTER — Other Ambulatory Visit: Payer: Self-pay | Admitting: Radiology

## 2017-02-04 VITALS — BP 107/65 | HR 117 | Temp 97.4°F | Resp 18

## 2017-02-04 DIAGNOSIS — C4A9 Merkel cell carcinoma, unspecified: Secondary | ICD-10-CM

## 2017-02-04 LAB — CBC WITH DIFFERENTIAL (CANCER CENTER ONLY)
BASOS ABS: 0 10*3/uL (ref 0.0–0.1)
Basophils Relative: 0 %
Eosinophils Absolute: 0.1 10*3/uL (ref 0.0–0.5)
Eosinophils Relative: 1 %
HCT: 32.4 % — ABNORMAL LOW (ref 34.8–46.6)
Hemoglobin: 10.4 g/dL — ABNORMAL LOW (ref 11.6–15.9)
LYMPHS ABS: 1.4 10*3/uL (ref 0.9–3.3)
LYMPHS PCT: 13 %
MCH: 28.7 pg (ref 26.0–34.0)
MCHC: 32.1 g/dL (ref 32.0–36.0)
MCV: 89.5 fL (ref 81.0–101.0)
MONO ABS: 0.6 10*3/uL (ref 0.1–0.9)
Monocytes Relative: 6 %
Neutro Abs: 8.7 10*3/uL — ABNORMAL HIGH (ref 1.5–6.5)
Neutrophils Relative %: 80 %
PLATELETS: 192 10*3/uL (ref 145–400)
RBC: 3.62 MIL/uL — ABNORMAL LOW (ref 3.70–5.32)
RDW: 14.6 % (ref 11.1–15.7)
WBC Count: 10.8 10*3/uL — ABNORMAL HIGH (ref 3.9–10.3)

## 2017-02-04 MED ORDER — SODIUM CHLORIDE 0.9 % IV SOLN
INTRAVENOUS | Status: DC
  Administered 2017-02-04: 14:00:00 via INTRAVENOUS

## 2017-02-04 MED ORDER — CALCITONIN (SALMON) 200 UNIT/ML IJ SOLN
500.0000 [IU] | Freq: Once | INTRAMUSCULAR | Status: DC
  Filled 2017-02-04: qty 2.5

## 2017-02-04 MED ORDER — CALCITONIN (SALMON) 200 UNIT/ML IJ SOLN
500.0000 [IU] | Freq: Once | INTRAMUSCULAR | Status: AC
  Administered 2017-02-04: 500 [IU] via SUBCUTANEOUS
  Filled 2017-02-04: qty 2.5

## 2017-02-04 MED ORDER — DENOSUMAB 120 MG/1.7ML ~~LOC~~ SOLN
120.0000 mg | Freq: Once | SUBCUTANEOUS | Status: AC
  Administered 2017-02-04: 120 mg via SUBCUTANEOUS

## 2017-02-04 MED ORDER — DENOSUMAB 120 MG/1.7ML ~~LOC~~ SOLN
SUBCUTANEOUS | Status: AC
  Filled 2017-02-04: qty 1.7

## 2017-02-04 NOTE — Patient Instructions (Signed)
Calcitonin injection What is this medicine? CALCITONIN (kal si TOE nin) is a hormone. It helps control calcium in the body. It is used to treat Paget's disease, osteoporosis, and hypercalcemia. This medicine may be used for other purposes; ask your health care provider or pharmacist if you have questions. COMMON BRAND NAME(S): Miacalcin What should I tell my health care provider before I take this medicine? They need to know if you have any of these conditions: -bone cancer -low level of blood calcium -an unusual or allergic reaction to calcitonin, fish, other medicines, foods, dyes, or preservatives -pregnant or trying to get pregnant -breast-feeding How should I use this medicine? This medicine is for injection under the skin or into a muscle. You will be taught how to prepare and give this medicine. Use exactly as directed. Take your medicine at regular intervals. Do not take your medicine more often than directed. It is important that you put your used needles and syringes in a special sharps container. Do not put them in a trash can. If you do not have a sharps container, call your pharmacist or healthcare provider to get one. Talk to your pediatrician regarding the use of this medicine in children. Special care may be needed. Patients over 42 years old may have a stronger reaction and need a smaller dose. Overdosage: If you think you have taken too much of this medicine contact a poison control center or emergency room at once. NOTE: This medicine is only for you. Do not share this medicine with others. What if I miss a dose? If you miss a dose, use it as soon as you can. If it is almost time for your next dose, use only that dose. Do not use double or extra doses. What may interact with this medicine? -lithium This list may not describe all possible interactions. Give your health care provider a list of all the medicines, herbs, non-prescription drugs, or dietary supplements you use.  Also tell them if you smoke, drink alcohol, or use illegal drugs. Some items may interact with your medicine. What should I watch for while using this medicine? Visit your doctor or health care professional for regular checks on your progress. You will need regular blood tests while using this medicine. Talk to your doctor about your risk of cancer. You may be more at risk for certain types of cancers if you take this medicine. You may need to be on a special diet while taking this medicine. Check with your doctor. Ask if you need to take extra calcium or vitamin D while taking this medicine. What side effects may I notice from receiving this medicine? Side effects that you should report to your doctor or health care professional as soon as possible: -allergic reactions like skin rash, itching or hives, swelling of the face, lips, or tongue -breathing problems -chest pain, tightness -dizziness -fever, chills -tingling in the hands, feet Side effects that usually do not require medical attention (report to your doctor or health care professional if they continue or are bothersome): -back or joint pain -flushing -loss of appetite -nausea, vomiting -pain, swelling where injected -stomach pain -swollen feet -tremors This list may not describe all possible side effects. Call your doctor for medical advice about side effects. You may report side effects to FDA at 1-800-FDA-1088. Where should I keep my medicine? Keep out of the reach of children. Store in a refrigerator between 2 and 8 degrees C (36 and 46 degrees F). Do not freeze. Throw  away any unused medicine after the expiration date. NOTE: This sheet is a summary. It may not cover all possible information. If you have questions about this medicine, talk to your doctor, pharmacist, or health care provider.  2018 Elsevier/Gold Standard (2012-04-03 12:10:57)  Denosumab injection What is this medicine? DENOSUMAB (den oh sue mab) slows bone  breakdown. Prolia is used to treat osteoporosis in women after menopause and in men. Delton See is used to treat a high calcium level due to cancer and to prevent bone fractures and other bone problems caused by multiple myeloma or cancer bone metastases. Delton See is also used to treat giant cell tumor of the bone. This medicine may be used for other purposes; ask your health care provider or pharmacist if you have questions. COMMON BRAND NAME(S): Prolia, XGEVA What should I tell my health care provider before I take this medicine? They need to know if you have any of these conditions: -dental disease -having surgery or tooth extraction -infection -kidney disease -low levels of calcium or Vitamin D in the blood -malnutrition -on hemodialysis -skin conditions or sensitivity -thyroid or parathyroid disease -an unusual reaction to denosumab, other medicines, foods, dyes, or preservatives -pregnant or trying to get pregnant -breast-feeding How should I use this medicine? This medicine is for injection under the skin. It is given by a health care professional in a hospital or clinic setting. If you are getting Prolia, a special MedGuide will be given to you by the pharmacist with each prescription and refill. Be sure to read this information carefully each time. For Prolia, talk to your pediatrician regarding the use of this medicine in children. Special care may be needed. For Delton See, talk to your pediatrician regarding the use of this medicine in children. While this drug may be prescribed for children as young as 13 years for selected conditions, precautions do apply. Overdosage: If you think you have taken too much of this medicine contact a poison control center or emergency room at once. NOTE: This medicine is only for you. Do not share this medicine with others. What if I miss a dose? It is important not to miss your dose. Call your doctor or health care professional if you are unable to keep an  appointment. What may interact with this medicine? Do not take this medicine with any of the following medications: -other medicines containing denosumab This medicine may also interact with the following medications: -medicines that lower your chance of fighting infection -steroid medicines like prednisone or cortisone This list may not describe all possible interactions. Give your health care provider a list of all the medicines, herbs, non-prescription drugs, or dietary supplements you use. Also tell them if you smoke, drink alcohol, or use illegal drugs. Some items may interact with your medicine. What should I watch for while using this medicine? Visit your doctor or health care professional for regular checks on your progress. Your doctor or health care professional may order blood tests and other tests to see how you are doing. Call your doctor or health care professional for advice if you get a fever, chills or sore throat, or other symptoms of a cold or flu. Do not treat yourself. This drug may decrease your body's ability to fight infection. Try to avoid being around people who are sick. You should make sure you get enough calcium and vitamin D while you are taking this medicine, unless your doctor tells you not to. Discuss the foods you eat and the vitamins you take  with your health care professional. See your dentist regularly. Brush and floss your teeth as directed. Before you have any dental work done, tell your dentist you are receiving this medicine. Do not become pregnant while taking this medicine or for 5 months after stopping it. Talk with your doctor or health care professional about your birth control options while taking this medicine. Women should inform their doctor if they wish to become pregnant or think they might be pregnant. There is a potential for serious side effects to an unborn child. Talk to your health care professional or pharmacist for more information. What side  effects may I notice from receiving this medicine? Side effects that you should report to your doctor or health care professional as soon as possible: -allergic reactions like skin rash, itching or hives, swelling of the face, lips, or tongue -bone pain -breathing problems -dizziness -jaw pain, especially after dental work -redness, blistering, peeling of the skin -signs and symptoms of infection like fever or chills; cough; sore throat; pain or trouble passing urine -signs of low calcium like fast heartbeat, muscle cramps or muscle pain; pain, tingling, numbness in the hands or feet; seizures -unusual bleeding or bruising -unusually weak or tired Side effects that usually do not require medical attention (report to your doctor or health care professional if they continue or are bothersome): -constipation -diarrhea -headache -joint pain -loss of appetite -muscle pain -runny nose -tiredness -upset stomach This list may not describe all possible side effects. Call your doctor for medical advice about side effects. You may report side effects to FDA at 1-800-FDA-1088. Where should I keep my medicine? This medicine is only given in a clinic, doctor's office, or other health care setting and will not be stored at home. NOTE: This sheet is a summary. It may not cover all possible information. If you have questions about this medicine, talk to your doctor, pharmacist, or health care provider.  2018 Elsevier/Gold Standard (2016-01-31 19:17:21)

## 2017-02-04 NOTE — Addendum Note (Signed)
Addended by: Sherry Ruffing D on: 02/04/2017 02:14 PM   Modules accepted: Orders

## 2017-02-05 ENCOUNTER — Encounter (HOSPITAL_COMMUNITY): Payer: Self-pay | Admitting: Diagnostic Radiology

## 2017-02-05 ENCOUNTER — Ambulatory Visit (HOSPITAL_COMMUNITY)
Admission: RE | Admit: 2017-02-05 | Discharge: 2017-02-05 | Disposition: A | Discharge status: Home / Self Care | Payer: Medicare Other | Source: Ambulatory Visit | Attending: Hematology & Oncology | Admitting: Hematology & Oncology

## 2017-02-05 ENCOUNTER — Other Ambulatory Visit: Payer: Self-pay | Admitting: Hematology & Oncology

## 2017-02-05 DIAGNOSIS — C7800 Secondary malignant neoplasm of unspecified lung: Secondary | ICD-10-CM | POA: Diagnosis not present

## 2017-02-05 DIAGNOSIS — Z79891 Long term (current) use of opiate analgesic: Secondary | ICD-10-CM | POA: Diagnosis not present

## 2017-02-05 DIAGNOSIS — R000 Tachycardia, unspecified: Secondary | ICD-10-CM | POA: Diagnosis not present

## 2017-02-05 DIAGNOSIS — F329 Major depressive disorder, single episode, unspecified: Secondary | ICD-10-CM | POA: Diagnosis not present

## 2017-02-05 DIAGNOSIS — C4A9 Merkel cell carcinoma, unspecified: Secondary | ICD-10-CM

## 2017-02-05 DIAGNOSIS — E559 Vitamin D deficiency, unspecified: Secondary | ICD-10-CM | POA: Insufficient documentation

## 2017-02-05 DIAGNOSIS — M810 Age-related osteoporosis without current pathological fracture: Secondary | ICD-10-CM | POA: Insufficient documentation

## 2017-02-05 DIAGNOSIS — Z87891 Personal history of nicotine dependence: Secondary | ICD-10-CM | POA: Diagnosis not present

## 2017-02-05 DIAGNOSIS — Z79899 Other long term (current) drug therapy: Secondary | ICD-10-CM | POA: Diagnosis not present

## 2017-02-05 DIAGNOSIS — C7951 Secondary malignant neoplasm of bone: Secondary | ICD-10-CM | POA: Diagnosis not present

## 2017-02-05 DIAGNOSIS — C787 Secondary malignant neoplasm of liver and intrahepatic bile duct: Secondary | ICD-10-CM | POA: Diagnosis not present

## 2017-02-05 HISTORY — PX: IR US GUIDE VASC ACCESS RIGHT: IMG2390

## 2017-02-05 HISTORY — PX: IR FLUORO GUIDE PORT INSERTION RIGHT: IMG5741

## 2017-02-05 LAB — BASIC METABOLIC PANEL
ANION GAP: 15 (ref 5–15)
BUN: 21 mg/dL — ABNORMAL HIGH (ref 6–20)
CALCIUM: 9.8 mg/dL (ref 8.9–10.3)
CO2: 23 mmol/L (ref 22–32)
CREATININE: 0.71 mg/dL (ref 0.44–1.00)
Chloride: 94 mmol/L — ABNORMAL LOW (ref 101–111)
Glucose, Bld: 118 mg/dL — ABNORMAL HIGH (ref 65–99)
Potassium: 3.8 mmol/L (ref 3.5–5.1)
SODIUM: 132 mmol/L — AB (ref 135–145)

## 2017-02-05 LAB — CBC
HCT: 32.8 % — ABNORMAL LOW (ref 36.0–46.0)
HEMOGLOBIN: 10.4 g/dL — AB (ref 12.0–15.0)
MCH: 27.4 pg (ref 26.0–34.0)
MCHC: 31.7 g/dL (ref 30.0–36.0)
MCV: 86.5 fL (ref 78.0–100.0)
PLATELETS: 207 10*3/uL (ref 150–400)
RBC: 3.79 MIL/uL — AB (ref 3.87–5.11)
RDW: 15 % (ref 11.5–15.5)
WBC: 9.1 10*3/uL (ref 4.0–10.5)

## 2017-02-05 LAB — PROTIME-INR
INR: 1.19
PROTHROMBIN TIME: 15 s (ref 11.4–15.2)

## 2017-02-05 MED ORDER — FENTANYL CITRATE (PF) 100 MCG/2ML IJ SOLN
INTRAMUSCULAR | Status: AC | PRN
  Administered 2017-02-05 (×2): 50 ug via INTRAVENOUS

## 2017-02-05 MED ORDER — LIDOCAINE HCL 1 % IJ SOLN
INTRAMUSCULAR | Status: AC
  Filled 2017-02-05: qty 20

## 2017-02-05 MED ORDER — LIDOCAINE-EPINEPHRINE (PF) 1 %-1:200000 IJ SOLN
INTRAMUSCULAR | Status: AC | PRN
  Administered 2017-02-05: 10 mL

## 2017-02-05 MED ORDER — HEPARIN SOD (PORK) LOCK FLUSH 100 UNIT/ML IV SOLN
INTRAVENOUS | Status: AC
  Administered 2017-02-05: 500 [IU]
  Filled 2017-02-05: qty 5

## 2017-02-05 MED ORDER — SODIUM CHLORIDE 0.9 % IV SOLN
INTRAVENOUS | Status: DC
  Administered 2017-02-05: 11:00:00 via INTRAVENOUS

## 2017-02-05 MED ORDER — FENTANYL CITRATE (PF) 100 MCG/2ML IJ SOLN
INTRAMUSCULAR | Status: AC
  Filled 2017-02-05: qty 2

## 2017-02-05 MED ORDER — LIDOCAINE-EPINEPHRINE (PF) 1 %-1:200000 IJ SOLN
INTRAMUSCULAR | Status: AC
  Filled 2017-02-05: qty 30

## 2017-02-05 MED ORDER — MIDAZOLAM HCL 2 MG/2ML IJ SOLN
INTRAMUSCULAR | Status: AC | PRN
  Administered 2017-02-05 (×3): 1 mg via INTRAVENOUS

## 2017-02-05 MED ORDER — LIDOCAINE HCL (PF) 1 % IJ SOLN
INTRAMUSCULAR | Status: AC | PRN
  Administered 2017-02-05: 10 mL

## 2017-02-05 MED ORDER — CEFAZOLIN SODIUM-DEXTROSE 2-4 GM/100ML-% IV SOLN
INTRAVENOUS | Status: AC
  Filled 2017-02-05: qty 100

## 2017-02-05 MED ORDER — MIDAZOLAM HCL 2 MG/2ML IJ SOLN
INTRAMUSCULAR | Status: AC
  Filled 2017-02-05: qty 4

## 2017-02-05 MED ORDER — CEFAZOLIN SODIUM-DEXTROSE 2-4 GM/100ML-% IV SOLN
2.0000 g | INTRAVENOUS | Status: AC
  Administered 2017-02-05: 2 g via INTRAVENOUS

## 2017-02-05 MED ORDER — HEPARIN SOD (PORK) LOCK FLUSH 100 UNIT/ML IV SOLN
INTRAVENOUS | Status: AC | PRN
  Administered 2017-02-05: 500 [IU]

## 2017-02-05 MED FILL — XTAMPZA ER 13.5 MG C12A: 13.5 | 30 days supply | Qty: 60 | Fill #0

## 2017-02-05 NOTE — Sedation Documentation (Signed)
Patient is resting comfortably. 

## 2017-02-05 NOTE — Procedures (Signed)
Placement of right jugular port.  Tip at SVC/RA junction.  Minimal blood loss and no immediate complication.  

## 2017-02-05 NOTE — Discharge Instructions (Signed)
Implanted Port Insertion, Care After °This sheet gives you information about how to care for yourself after your procedure. Your health care provider may also give you more specific instructions. If you have problems or questions, contact your health care provider. °What can I expect after the procedure? °After your procedure, it is common to have: °· Discomfort at the port insertion site. °· Bruising on the skin over the port. This should improve over 3-4 days. ° °Follow these instructions at home: °Port care °· After your port is placed, you will get a manufacturer's information card. The card has information about your port. Keep this card with you at all times. °· Take care of the port as told by your health care provider. Ask your health care provider if you or a family member can get training for taking care of the port at home. A home health care nurse may also take care of the port. °· Make sure to remember what type of port you have. °Incision care °· Follow instructions from your health care provider about how to take care of your port insertion site. Make sure you: °? Wash your hands with soap and water before you change your bandage (dressing). If soap and water are not available, use hand sanitizer. °? Change your dressing as told by your health care provider. °? Leave stitches (sutures), skin glue, or adhesive strips in place. These skin closures may need to stay in place for 2 weeks or longer. If adhesive strip edges start to loosen and curl up, you may trim the loose edges. Do not remove adhesive strips completely unless your health care provider tells you to do that. °· Check your port insertion site every day for signs of infection. Check for: °? More redness, swelling, or pain. °? More fluid or blood. °? Warmth. °? Pus or a bad smell. °General instructions °· Do not take baths, swim, or use a hot tub until your health care provider approves. °· Do not lift anything that is heavier than 10 lb (4.5  kg) for a week, or as told by your health care provider. °· Ask your health care provider when it is okay to: °? Return to work or school. °? Resume usual physical activities or sports. °· Do not drive for 24 hours if you were given a medicine to help you relax (sedative). °· Take over-the-counter and prescription medicines only as told by your health care provider. °· Wear a medical alert bracelet in case of an emergency. This will tell any health care providers that you have a port. °· Keep all follow-up visits as told by your health care provider. This is important. °Contact a health care provider if: °· You cannot flush your port with saline as directed, or you cannot draw blood from the port. °· You have a fever or chills. °· You have more redness, swelling, or pain around your port insertion site. °· You have more fluid or blood coming from your port insertion site. °· Your port insertion site feels warm to the touch. °· You have pus or a bad smell coming from the port insertion site. °Get help right away if: °· You have chest pain or shortness of breath. °· You have bleeding from your port that you cannot control. °Summary °· Take care of the port as told by your health care provider. °· Change your dressing as told by your health care provider. °· Keep all follow-up visits as told by your health care provider. °  This information is not intended to replace advice given to you by your health care provider. Make sure you discuss any questions you have with your health care provider. °Document Released: 10/29/2012 Document Revised: 11/30/2015 Document Reviewed: 11/30/2015 °Elsevier Interactive Patient Education © 2017 [employer] ° °

## 2017-02-05 NOTE — H&P (Signed)
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  She presents today in her usual state of health.  She has been NPO.  She does not take blood thinners.   Past Medical History:  Diagnosis Date  . Arthritis   . Depression   . GERD (gastroesophageal reflux disease)   . Hyperlipidemia   . 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 Osteoporosis   . Vitamin D deficiency     Past Surgical History:  Procedure Laterality Date  . ABDOMINAL HYSTERECTOMY    . SHOULDER SURGERY     right    Allergies: Evista [raloxifene hcl]; Fosamax [alendronate sodium]; Gabapentin; and Nsaids  Medications: Prior to Admission medications   Medication Sig Start Date End Date Taking? Authorizing Provider  oxyCODONE 10 MG TABS Take 1 tablet (10 mg total) by mouth every 6 (six) hours as needed for severe pain. 02/01/17  Yes Ennever, Rudell Cobb, MD  busPIRone (BUSPAR) 30 MG tablet Take 0.5 tablets (15 mg total) by mouth 2 (two) times daily. 01/14/17   Tower, Wynelle Fanny, MD  Calcium Carb-Cholecalciferol (CALCIUM 600 + D PO) Take 2 tablets by mouth daily.    [provider]  cholecalciferol (VITAMIN D) 1000 UNITS tablet Take 5,000 Units by mouth daily.     [provider]  ENULOSE 10 GM/15ML SOLN  01/21/17   [provider]  lactulose (CHRONULAC) 10 GM/15ML solution Take 15 mLs (10 g total) by mouth every 4 (four) hours as needed for severe constipation. 01/21/17   Volanda Napoleon, MD  megestrol (MEGACE) 400 MG/10ML suspension Take 10 mLs (400 mg total) by mouth 2 (two) times daily. 02/01/17   Volanda Napoleon, MD    OxyCODONE ER [facility]) 13.5 MG C12A Take 13.5 mg by mouth every 12 (twelve) hours. 02/01/17   Volanda Napoleon, MD  oxyCODONE-acetaminophen (PERCOCET/ROXICET) 5-325 MG tablet  01/28/17   [provider]  predniSONE (STERAPRED UNI-PAK 21 TAB) 10 MG (21) TBPK tablet Take 6 tablets on day 1 Take 5 tablets on day 2 Take 4 tablets on day 3 Take 3 tablets on day 4 Take 2 tablets on day 5 Take 1 tablet on day 6 01/19/17   Triplett, Cari B, FNP  promethazine (PHENERGAN) 25 MG tablet Take 1 tablet (25 mg total) by mouth every 8 (eight) hours as needed for nausea or vomiting. 01/14/17   Tower, Wynelle Fanny, MD  tiZANidine (ZANAFLEX) 4 MG tablet Take 1 tablet (4 mg total) by mouth every 6 (six) hours as needed for muscle spasms. Caution of sedation 01/18/17   Tower, Wynelle Fanny, MD  traMADol (ULTRAM) 50 MG tablet TAKE 1 TABLET BY MOUTH EVERY 8 HOURS AS NEEDED. 01/10/17   Tower, Wynelle Fanny, MD  tretinoin (RETIN-A) 0.05 % cream  10/31/13   [provider]  zolpidem (AMBIEN) 10 MG tablet TAKE 1 TABLET BY MOUTH AT BEDTIME AS NEEDED FOR SLEEP 11/23/16   Tower, Wynelle Fanny, MD     Family History  Problem Relation Age of Onset  . Diabetes Father   . Cancer Father        lung cancer  . Cancer Sister  breast cancer  . Cancer Brother        lung cancer  . Coronary artery disease Paternal Uncle   . Cancer Maternal Grandfather        possible prostate  . Coronary artery disease Paternal Grandfather   . Heart disease Paternal Grandfather        CAD    Social History   Socioeconomic History  . Marital status: Married    Spouse name: Not on file  . Number of children: Not on file  . Years of education: Not on file  . Highest education level: Not on file  Social Needs  . Financial resource strain: Not on file  . Food insecurity - worry: Not on file  . Food insecurity - inability: Not on file  . Transportation needs - medical: Not on file  . Transportation needs - non-medical: Not on file   Occupational History  . Not on file  Tobacco Use  . Smoking status: Former Smoker    Last attempt to quit: 01/22/2005    Years since quitting: 12.0  . Smokeless tobacco: Never Used  Substance and Sexual Activity  . Alcohol use: No    Alcohol/week: 0.0 oz  . Drug use: No  . Sexual activity: Not on file  Other Topics Concern  . Not on file  Social History Narrative  . Not on file    Review of Systems  Constitutional: Negative for fatigue and fever.  Respiratory: Negative for cough and shortness of breath.   Cardiovascular: Negative for chest pain.  Gastrointestinal: Negative for abdominal pain.  Psychiatric/Behavioral: Negative for behavioral problems and confusion.    Vital Signs: BP (!) 154/81 (BP Location: Right Arm)   Pulse (!) 111   Temp 97.8 F (36.6 C) (Oral)   Ht 5\' 6"  (1.676 m)   Wt 186 lb (84.4 kg)   SpO2 95%   BMI 30.02 kg/m   Physical Exam  Constitutional: She appears well-developed.  Cardiovascular: Normal rate, regular rhythm and normal heart sounds.  Pulmonary/Chest: Effort normal and breath sounds normal. No respiratory distress.  Abdominal: Soft.  Skin: Skin is warm and dry.  Psychiatric: She has a normal mood and affect. Her behavior is normal. Judgment and thought content normal.  Nursing note and vitals reviewed.   Imaging: Nm Lymph/gland  Result Date: 01/17/2017 CLINICAL DATA:  Merkel cell carcinoma right lateral calf. EXAM: NUCLEAR MEDICINE LYMPHANGIOGRAPHY TECHNIQUE: Sequential images were obtained following intradermal injection of radiopharmaceutical at the tumor site in the right calf. RADIOPHARMACEUTICALS:  0.5 mCi millipore-filtered Tc-80m sulfur colloid COMPARISON:  None. FINDINGS: No popliteal, inguinal or pelvic lymph nodes are identified. IMPRESSION: Negative for right lower extremity or pelvic lymph nodes. Electronically Signed   By: Marijo Sanes M.D.   On: 01/17/2017 13:50   Nm Pet Image Initial (pi) Whole Body  Result Date:  01/30/2017 CLINICAL DATA:  Subsequent treatment strategy for Merkel cell carcinoma of the right lower extremity. EXAM: NUCLEAR MEDICINE PET WHOLE BODY TECHNIQUE: 12.8 mCi F-18 FDG was injected intravenously. Full-ring PET imaging was performed from the vertex to the feet after the radiotracer. CT data was obtained and used for attenuation correction and anatomic localization. FASTING BLOOD GLUCOSE:  Value: 111 mg/dl COMPARISON:  None. FINDINGS: HEAD/NECK A cluster of enlarged lymph nodes in the medial left supraclavicular region/thoracic inlet demonstrates hypermetabolism. Index 15 millimetres lymph node seen on image 104 of series 3 demonstrates SUV max = 9.4. CHEST Innumerable bilateral pulmonary nodules of varying size are identified.  The larger nodules are hypermetabolic. Index 1.5 cm nodule in the left lower lobe on image 150 of series 3 demonstrates SUV max = 4.5. Metastatic lymphadenopathy in the mediastinum is identified. 2.5 cm short axis lymph node posterior to the carina demonstrates SUV max = 12.7. Hypermetabolic lymphadenopathy posterior to the descending thoracic aorta demonstrates SUV max = 10.1. Retrocrural metastatic lymphadenopathy is seen bilaterally. 17 mm short axis left retrocrural lymph node on image 187 demonstrates SUV max = 10.0. Atherosclerotic calcification is seen in the coronary arteries and thoracic aorta. No evidence for pericardial effusion. ABDOMEN/PELVIS Multiple hypermetabolic liver lesions are evident. 18 mm lesion in the medial segment of the left liver demonstrates SUV max = 11 0.0. Bulky hypermetabolic lymphadenopathy is identified in the retroperitoneal space of the abdomen. 5.6 x 3.9 cm nodal conglomeration, likely incorporating the IVC (image 229 of series 3), demonstrates SUV max = 13.6. Bulky right pelvic sidewall lymphadenopathy is identified. A nodal conglomeration along the right pelvic sidewall seen on image 265 measures 9.9 x 3.3 cm. This demonstrates SUV max = 12.8.  Lymphadenopathy in the right groin measures 4.4 x 5.5 on image 286 with SUV max = 14.2. Mild right hydroureteronephrosis. Ureteral obstruction is at the level of the lymphadenopathy along the distal aspect of the abdominal aorta. Patient is status post cholecystectomy. There are numerous small mesenteric nodule showing low level hypermetabolism, compatible with metastatic disease. Diverticular changes are noted in the left colon. SKELETON Scattered areas of hypermetabolism identified in the bony anatomy, involving ribs, cervical spine, thoracic spine, lumbar spine and bony pelvis. Uptake is identified in each femur. Index lesion in the L4 vertebral body demonstrates SUV max = 6.6. EXTREMITIES No evidence for hypermetabolic soft tissue disease in the lower extremities distal to the right groin lymphadenopathy although there is bony involvement in both lower extremities. No soft tissue involvement is identified in either upper extremity although hypermetabolic disease is identified in each humerus. Soft tissue edema is identified in the right lower extremity diffusely. IMPRESSION: 1. Hypermetabolic metastatic lymphadenopathy in the left thoracic inlet, mediastinum, retrocrural space, abdominal retroperitoneum, right pelvic sidewall, and right groin. 2. Innumerable pulmonary nodules consistent with metastatic disease. Nodules large enough to be resolved on PET imaging are hypermetabolic. 3. Multiple large hypermetabolic liver metastases. 4. Multiple small mesenteric nodules compatible with metastatic involvement of the mesentery. 5. Diffuse metastatic bony involvement. 6. Diffuse edema right lower extremity. 7.  Aortic Atherosclerois (ICD10-170.0) Electronically Signed   By: Misty Stanley M.D.   On: 01/30/2017 14:07    Labs:  CBC: Recent Labs    01/02/17 1506 02/01/17 0922 02/05/17 0858  WBC 8.9  --  9.1  HGB 12.4  --  10.4*  HCT 38.3 32.4* 32.8*  PLT 420*  --  207    COAGS: No results for input(s):  INR, APTT in the last 8760 hours.  BMP: Recent Labs    01/02/17 1506 02/01/17 0922  NA 144 133*  K 4.0 4.5  CL 100 93*  CO2 31 29  GLUCOSE 107 116  BUN 9 28*  CALCIUM 9.7 11.2*  CREATININE 0.9  --     LIVER FUNCTION TESTS: Recent Labs    01/02/17 1506 02/01/17 0922  BILITOT 0.50 0.7  AST 28 79*  ALT 23 24  ALKPHOS 86* 493*  PROT 7.3 6.8  ALBUMIN 3.3 2.3*    TUMOR MARKERS: No results for input(s): AFPTM, CEA, CA199, CHROMGRNA in the last 8760 hours.  Assessment and Plan: Patient with past  medical history of Merkel cell carcinoma presents with need of Port-A-Cath placement for upcoming chemotherapy/immunotherapy.  Patient presents today in her usual state of health.  She has been NPO and is not currently on blood thinners.  She does have tachycardia today and states she has no cardiac history. EKG performed and shows sinus tachycardia, possibly related to anxiety.  Discussed with Dr. Anselm Pancoast who agree to proceed. Risks and benefits discussed with the patient including, but not limited to bleeding, infection, pneumothorax, or fibrin sheath development and need for additional procedures. All of the patient's questions were answered, patient is agreeable to proceed. Consent signed and in chart.  Thank you for this interesting consult.  I greatly enjoyed meeting [name] [name] and look forward to participating in their care.  A copy of this report was sent to the requesting provider on this date.  Electronically Signed: Docia Barrier, PA 02/05/2017, 9:31 AM   I spent a total of  30 Minutes   in face to face in clinical consultation, greater than 50% of which was counseling/coordinating care for lung cancer.

## 2017-02-05 NOTE — Sedation Documentation (Signed)
Patient is resting comfortably. 

## 2017-02-05 NOTE — Addendum Note (Signed)
Addended by: Sherry Ruffing D on: 02/05/2017 03:24 PM   Modules accepted: Orders

## 2017-02-06 ENCOUNTER — Other Ambulatory Visit: Payer: Self-pay | Admitting: *Deleted

## 2017-02-06 DIAGNOSIS — C4A9 Merkel cell carcinoma, unspecified: Secondary | ICD-10-CM

## 2017-02-07 ENCOUNTER — Inpatient Hospital Stay: Payer: Medicare Other

## 2017-02-07 ENCOUNTER — Other Ambulatory Visit: Payer: Self-pay

## 2017-02-07 ENCOUNTER — Ambulatory Visit (HOSPITAL_BASED_OUTPATIENT_CLINIC_OR_DEPARTMENT_OTHER)
Admission: RE | Admit: 2017-02-07 | Discharge: 2017-02-07 | Disposition: A | Discharge status: Home / Self Care | Payer: Medicare Other | Source: Ambulatory Visit | Attending: Hematology & Oncology | Admitting: Hematology & Oncology

## 2017-02-07 ENCOUNTER — Other Ambulatory Visit: Payer: Self-pay | Admitting: *Deleted

## 2017-02-07 VITALS — BP 133/73 | HR 103 | Temp 97.9°F | Resp 19

## 2017-02-07 DIAGNOSIS — R590 Localized enlarged lymph nodes: Secondary | ICD-10-CM | POA: Insufficient documentation

## 2017-02-07 DIAGNOSIS — C4A9 Merkel cell carcinoma, unspecified: Secondary | ICD-10-CM | POA: Insufficient documentation

## 2017-02-07 DIAGNOSIS — R9389 Abnormal findings on diagnostic imaging of other specified body structures: Secondary | ICD-10-CM | POA: Insufficient documentation

## 2017-02-07 LAB — CMP (CANCER CENTER ONLY)
ALT: 38 U/L (ref 0–55)
ANION GAP: 14 (ref 5–15)
AST: 95 U/L — ABNORMAL HIGH (ref 5–34)
Albumin: 2.6 g/dL — ABNORMAL LOW (ref 3.5–5.0)
Alkaline Phosphatase: 481 U/L — ABNORMAL HIGH (ref 26–84)
BILIRUBIN TOTAL: 0.9 mg/dL (ref 0.2–1.2)
BUN: 25 mg/dL — ABNORMAL HIGH (ref 7–22)
CO2: 28 mmol/L (ref 18–33)
Calcium: 9.5 mg/dL (ref 8.0–10.3)
Chloride: 93 mmol/L — ABNORMAL LOW (ref 98–108)
Creatinine: 0.9 mg/dL (ref 0.60–1.10)
GLUCOSE: 107 mg/dL (ref 73–118)
POTASSIUM: 4.2 mmol/L (ref 3.5–5.1)
Sodium: 135 mmol/L (ref 128–145)
TOTAL PROTEIN: 6.8 g/dL (ref 6.4–8.1)

## 2017-02-07 LAB — CBC WITH DIFFERENTIAL (CANCER CENTER ONLY)
BASOS ABS: 0 10*3/uL (ref 0.0–0.1)
BASOS PCT: 0 %
Eosinophils Absolute: 0.1 10*3/uL (ref 0.0–0.5)
Eosinophils Relative: 1 %
HEMATOCRIT: 30.8 % — AB (ref 34.8–46.6)
HEMOGLOBIN: 10 g/dL — AB (ref 11.6–15.9)
LYMPHS PCT: 16 %
Lymphs Abs: 1.6 10*3/uL (ref 0.9–3.3)
MCH: 28.8 pg (ref 26.0–34.0)
MCHC: 32.5 g/dL (ref 32.0–36.0)
MCV: 88.8 fL (ref 81.0–101.0)
MONOS PCT: 6 %
Monocytes Absolute: 0.6 10*3/uL (ref 0.1–0.9)
NEUTROS ABS: 7.6 10*3/uL — AB (ref 1.5–6.5)
NEUTROS PCT: 77 %
Platelet Count: 186 10*3/uL (ref 145–400)
RBC: 3.47 MIL/uL — ABNORMAL LOW (ref 3.70–5.32)
RDW: 14.9 % (ref 11.1–15.7)
WBC Count: 9.9 10*3/uL (ref 3.9–10.3)

## 2017-02-07 LAB — LACTATE DEHYDROGENASE: LDH: 1945 U/L — AB (ref 125–245)

## 2017-02-07 LAB — TSH: TSH: 1.708 u[IU]/mL (ref 0.308–3.960)

## 2017-02-07 MED ORDER — ONDANSETRON HCL 8 MG PO TABS: 8.0000 mg | ORAL_TABLET | Freq: Two times a day (BID) | ORAL | 1 refills | Status: DC | PRN

## 2017-02-07 MED ORDER — SODIUM CHLORIDE 0.9% FLUSH
3.0000 mL | INTRAVENOUS | Status: DC | PRN
  Filled 2017-02-07: qty 10

## 2017-02-07 MED ORDER — DIPHENHYDRAMINE HCL 50 MG/ML IJ SOLN
25.0000 mg | Freq: Once | INTRAMUSCULAR | Status: AC
  Administered 2017-02-07: 25 mg via INTRAVENOUS

## 2017-02-07 MED ORDER — HEPARIN SOD (PORK) LOCK FLUSH 100 UNIT/ML IV SOLN
500.0000 [IU] | Freq: Once | INTRAVENOUS | Status: AC | PRN
  Administered 2017-02-07: 500 [IU]
  Filled 2017-02-07: qty 5

## 2017-02-07 MED ORDER — PROCHLORPERAZINE MALEATE 10 MG PO TABS: 10.0000 mg | ORAL_TABLET | Freq: Four times a day (QID) | ORAL | 1 refills | Status: DC | PRN

## 2017-02-07 MED ORDER — ACETAMINOPHEN 325 MG PO TABS
650.0000 mg | ORAL_TABLET | Freq: Once | ORAL | Status: AC
  Administered 2017-02-07: 650 mg via ORAL

## 2017-02-07 MED ORDER — LIDOCAINE-PRILOCAINE 2.5-2.5 % EX CREA: TOPICAL_CREAM | CUTANEOUS | 3 refills | Status: DC

## 2017-02-07 MED ORDER — AVELUMAB CHEMO INJECTION 200 MG/10ML IV SOLN
800.0000 mg | Freq: Once | INTRAVENOUS | Status: AC
  Administered 2017-02-07: 800 mg via INTRAVENOUS
  Filled 2017-02-07: qty 40

## 2017-02-07 MED ORDER — DIPHENHYDRAMINE HCL 50 MG/ML IJ SOLN
INTRAMUSCULAR | Status: AC
  Filled 2017-02-07: qty 1

## 2017-02-07 MED ORDER — SODIUM CHLORIDE 0.9% FLUSH
10.0000 mL | INTRAVENOUS | Status: DC | PRN
  Administered 2017-02-07: 10 mL
  Filled 2017-02-07: qty 10

## 2017-02-07 MED ORDER — SODIUM CHLORIDE 0.9 % IV SOLN
Freq: Once | INTRAVENOUS | Status: AC
  Administered 2017-02-07: 11:00:00 via INTRAVENOUS

## 2017-02-07 MED ORDER — LORAZEPAM 0.5 MG PO TABS: 0.5000 mg | ORAL_TABLET | Freq: Four times a day (QID) | ORAL | 0 refills | Status: DC | PRN

## 2017-02-07 MED ORDER — ACETAMINOPHEN 325 MG PO TABS
ORAL_TABLET | ORAL | Status: AC
  Filled 2017-02-07: qty 2

## 2017-02-07 NOTE — Patient Instructions (Signed)
[facility] Discharge Instructions for Patients Receiving Chemotherapy  Today you received the following chemotherapy agents [facility]  To help prevent nausea and vomiting after your treatment, we encourage you to take your nausea medication as instructed. If you develop nausea and vomiting that is not controlled by your nausea medication, call the clinic.   BELOW ARE SYMPTOMS THAT SHOULD BE REPORTED IMMEDIATELY:  *FEVER GREATER THAN 100.5 F  *CHILLS WITH OR WITHOUT FEVER  NAUSEA AND VOMITING THAT IS NOT CONTROLLED WITH YOUR NAUSEA MEDICATION  *UNUSUAL SHORTNESS OF BREATH  *UNUSUAL BRUISING OR BLEEDING  TENDERNESS IN MOUTH AND THROAT WITH OR WITHOUT PRESENCE OF ULCERS  *URINARY PROBLEMS  *BOWEL PROBLEMS  UNUSUAL RASH Items with * indicate a potential emergency and should be followed up as soon as possible.  Feel free to call the clinic should you have any questions or concerns. The clinic phone number is [phone].  Please show the [place] at check-in to the Emergency Department and triage nurse.

## 2017-02-07 NOTE — Progress Notes (Signed)
OK to treat with today's lab values and heart rate per Dr. Marin Olp.

## 2017-02-08 ENCOUNTER — Telehealth: Payer: Self-pay | Admitting: *Deleted

## 2017-02-08 NOTE — Telephone Encounter (Addendum)
Patient's husband is aware of results.   Per the chemo she received yesterday, patient's husband states she's doing well. She has no side effects and states she has no new symptoms. She believes her pain to be getting better now that she's on better pain management.   They have all needed prn medications at home. They have no questions or concerns at this time. They know to call the office with any problems or questions.   ----- Message from Volanda Napoleon, MD sent at 02/07/2017  5:18 PM EST ----- Call - NO blood clot in the right leg!!!!  Laurey Arrow

## 2017-02-11 ENCOUNTER — Telehealth: Payer: Self-pay

## 2017-02-11 NOTE — Telephone Encounter (Signed)
Received call from pt's spouse Patrick Jupiter reporting pt has become slightly SOB on exertion, such as walking to the bathroom. Denies worsening LE edema. Denies cough, fever. Reports breathing returns to baseline when pt is at rest. Patrick Jupiter states "I know that medicine she got can cause SOB so I just wanted to check.  Per Dr Marin Olp, pt can come in for CXR and labs but does not believe this to be immune-mediated pneumonitis that is rarely reported with Bavencio. Per [place], pt does not wish to come in today. Patrick Jupiter reports he will update the office if pt worsens and is aware if severe symptoms arise to call 911. dph

## 2017-02-14 ENCOUNTER — Ambulatory Visit (HOSPITAL_BASED_OUTPATIENT_CLINIC_OR_DEPARTMENT_OTHER)
Admission: RE | Admit: 2017-02-14 | Discharge: 2017-02-14 | Disposition: A | Discharge status: Home / Self Care | Payer: Medicare Other | Source: Ambulatory Visit | Attending: Family | Admitting: Family

## 2017-02-14 ENCOUNTER — Other Ambulatory Visit: Payer: Self-pay | Admitting: *Deleted

## 2017-02-14 ENCOUNTER — Other Ambulatory Visit: Payer: Self-pay | Admitting: Family

## 2017-02-14 ENCOUNTER — Inpatient Hospital Stay: Payer: Medicare Other

## 2017-02-14 ENCOUNTER — Telehealth: Payer: Self-pay | Admitting: *Deleted

## 2017-02-14 DIAGNOSIS — J9811 Atelectasis: Secondary | ICD-10-CM | POA: Diagnosis not present

## 2017-02-14 DIAGNOSIS — C4A9 Merkel cell carcinoma, unspecified: Secondary | ICD-10-CM

## 2017-02-14 DIAGNOSIS — D649 Anemia, unspecified: Secondary | ICD-10-CM

## 2017-02-14 DIAGNOSIS — R0602 Shortness of breath: Secondary | ICD-10-CM | POA: Diagnosis present

## 2017-02-14 DIAGNOSIS — C7800 Secondary malignant neoplasm of unspecified lung: Secondary | ICD-10-CM | POA: Insufficient documentation

## 2017-02-14 LAB — CMP (CANCER CENTER ONLY)
ALT: 40 U/L (ref 0–55)
AST: 96 U/L — ABNORMAL HIGH (ref 5–34)
Albumin: 2.6 g/dL — ABNORMAL LOW (ref 3.5–5.0)
Alkaline Phosphatase: 607 U/L — ABNORMAL HIGH (ref 26–84)
Anion gap: 11 (ref 5–15)
BILIRUBIN TOTAL: 1.1 mg/dL (ref 0.2–1.2)
BUN: 54 mg/dL — ABNORMAL HIGH (ref 7–22)
CHLORIDE: 92 mmol/L — AB (ref 98–108)
CO2: 26 mmol/L (ref 18–33)
Calcium: 7.7 mg/dL — ABNORMAL LOW (ref 8.0–10.3)
Creatinine: 1 mg/dL (ref 0.60–1.10)
Glucose, Bld: 107 mg/dL (ref 73–118)
Potassium: 4.6 mmol/L (ref 3.5–5.1)
Sodium: 129 mmol/L (ref 128–145)
Total Protein: 6.4 g/dL (ref 6.4–8.1)

## 2017-02-14 LAB — CBC WITH DIFFERENTIAL (CANCER CENTER ONLY)
BASOS ABS: 0 10*3/uL (ref 0.0–0.1)
Basophils Relative: 0 %
EOS PCT: 0 %
Eosinophils Absolute: 0 10*3/uL (ref 0.0–0.5)
HEMATOCRIT: 26.2 % — AB (ref 34.8–46.6)
Hemoglobin: 8.6 g/dL — ABNORMAL LOW (ref 11.6–15.9)
LYMPHS ABS: 1.6 10*3/uL (ref 0.9–3.3)
Lymphocytes Relative: 16 %
MCH: 28.5 pg (ref 26.0–34.0)
MCHC: 32.8 g/dL (ref 32.0–36.0)
MCV: 86.8 fL (ref 81.0–101.0)
MONOS PCT: 6 %
Monocytes Absolute: 0.6 10*3/uL (ref 0.1–0.9)
NEUTROS ABS: 7.9 10*3/uL — AB (ref 1.5–6.5)
Neutrophils Relative %: 78 %
Platelet Count: 141 10*3/uL — ABNORMAL LOW (ref 145–400)
RBC: 3.02 MIL/uL — ABNORMAL LOW (ref 3.70–5.32)
RDW: 15.5 % (ref 11.1–15.7)
WBC Count: 10.1 10*3/uL (ref 3.9–10.3)

## 2017-02-14 LAB — SAMPLE TO BLOOD BANK

## 2017-02-14 LAB — PREPARE RBC (CROSSMATCH)

## 2017-02-14 LAB — ABO/RH: ABO/RH(D): A POS

## 2017-02-14 MED ORDER — HEPARIN SOD (PORK) LOCK FLUSH 100 UNIT/ML IV SOLN
500.0000 [IU] | Freq: Once | INTRAVENOUS | Status: AC
  Administered 2017-02-14: 500 [IU] via INTRAVENOUS
  Filled 2017-02-14: qty 5

## 2017-02-14 MED ORDER — IPRATROPIUM-ALBUTEROL 0.5-2.5 (3) MG/3ML IN SOLN
RESPIRATORY_TRACT | Status: AC
  Filled 2017-02-14: qty 3

## 2017-02-14 MED ORDER — IPRATROPIUM-ALBUTEROL 0.5-2.5 (3) MG/3ML IN SOLN
3.0000 mL | Freq: Once | RESPIRATORY_TRACT | Status: AC
  Administered 2017-02-14: 3 mL via RESPIRATORY_TRACT

## 2017-02-14 MED ORDER — SODIUM CHLORIDE 0.9% FLUSH
10.0000 mL | INTRAVENOUS | Status: DC | PRN
  Administered 2017-02-14: 10 mL via INTRAVENOUS
  Filled 2017-02-14: qty 10

## 2017-02-14 NOTE — Telephone Encounter (Signed)
Patient's husband calling the office stating that patient's shortness of breath has gotten worse. She is barely able to ambulate to the bathroom without 'gasping'. He'd like to know if we can send in a prescription for an inhaler as patient doesn't want to come into the office.  Instructed the patient's husband that the patient needed to be assessed as this was the safest thing for her. We are unable to triage breathing difficulties over the phone and we could be missing a crucial piece of information. Patient and husband agreeable to coming in.  Orders placed for CXR and labs. Appointment made. Patient's husband aware.

## 2017-02-14 NOTE — Progress Notes (Signed)
Pt instructed to keep blue blood bracelet on for blood transfusion tomorrow.  Pt verbalized understanding to do so and has no questions at this time.

## 2017-02-15 ENCOUNTER — Encounter: Payer: Self-pay | Admitting: Hematology & Oncology

## 2017-02-15 ENCOUNTER — Inpatient Hospital Stay: Payer: Medicare Other

## 2017-02-15 DIAGNOSIS — C4A9 Merkel cell carcinoma, unspecified: Secondary | ICD-10-CM

## 2017-02-15 DIAGNOSIS — R0602 Shortness of breath: Secondary | ICD-10-CM

## 2017-02-15 DIAGNOSIS — D649 Anemia, unspecified: Secondary | ICD-10-CM

## 2017-02-15 MED ORDER — DIPHENHYDRAMINE HCL 25 MG PO CAPS
ORAL_CAPSULE | ORAL | Status: AC
Start: 2017-02-15 — End: ?
  Filled 2017-02-15: qty 1

## 2017-02-15 MED ORDER — SODIUM CHLORIDE 0.9 % IV SOLN
250.0000 mL | Freq: Once | INTRAVENOUS | Status: AC
  Administered 2017-02-15: 250 mL via INTRAVENOUS

## 2017-02-15 MED ORDER — SODIUM CHLORIDE 0.9% FLUSH
10.0000 mL | INTRAVENOUS | Status: DC | PRN
  Administered 2017-02-15: 10 mL via INTRAVENOUS
  Filled 2017-02-15: qty 10

## 2017-02-15 MED ORDER — HEPARIN SOD (PORK) LOCK FLUSH 100 UNIT/ML IV SOLN
500.0000 [IU] | Freq: Once | INTRAVENOUS | Status: AC
  Administered 2017-02-15: 500 [IU] via INTRAVENOUS
  Filled 2017-02-15: qty 5

## 2017-02-15 MED ORDER — ACETAMINOPHEN 325 MG PO TABS
ORAL_TABLET | ORAL | Status: AC
  Filled 2017-02-15: qty 2

## 2017-02-15 MED ORDER — FUROSEMIDE 10 MG/ML IJ SOLN: 20.0000 mg | Freq: Once | INTRAMUSCULAR | Status: DC

## 2017-02-15 MED ORDER — DIPHENHYDRAMINE HCL 25 MG PO CAPS
25.0000 mg | ORAL_CAPSULE | Freq: Once | ORAL | Status: AC
  Administered 2017-02-15: 25 mg via ORAL

## 2017-02-15 MED ORDER — ACETAMINOPHEN 325 MG PO TABS
650.0000 mg | ORAL_TABLET | Freq: Once | ORAL | Status: AC
  Administered 2017-02-15: 650 mg via ORAL

## 2017-02-15 NOTE — Patient Instructions (Signed)

## 2017-02-17 LAB — TYPE AND SCREEN
ABO/RH(D): A POS
Antibody Screen: NEGATIVE
UNIT DIVISION: 0
Unit division: 0

## 2017-02-17 LAB — BPAM RBC
BLOOD PRODUCT EXPIRATION DATE: 201902072359
Blood Product Expiration Date: 201902072359
ISSUE DATE / TIME: 201901250835
ISSUE DATE / TIME: 201901250835
Unit Type and Rh: 6200
Unit Type and Rh: 6200

## 2017-02-19 ENCOUNTER — Telehealth: Payer: Self-pay | Admitting: *Deleted

## 2017-02-19 MED ORDER — METHYLPREDNISOLONE 4 MG PO TBPK: ORAL_TABLET | ORAL | 0 refills | Status: DC

## 2017-02-19 NOTE — Telephone Encounter (Signed)
Patient's husband notifying the office that patient has a bright red rash to her back. He states it extends from the base of her neck, down to her hips. He's been applying hydrocortisone cream to it but it's not getting any better.   Reviewed symptoms with Dr Marin Olp. He would like patient to start on a Medrol Dose Pack.   Husband is aware of instructions and new prescription. Pharmacy confirmed.

## 2017-02-21 ENCOUNTER — Inpatient Hospital Stay (HOSPITAL_BASED_OUTPATIENT_CLINIC_OR_DEPARTMENT_OTHER)
Admission: EM | Admit: 2017-02-21 | Discharge: 2017-02-24 | DRG: 641 | Disposition: A | Discharge status: Home / Self Care | Payer: Medicare Other | Attending: Family Medicine | Admitting: Family Medicine

## 2017-02-21 ENCOUNTER — Other Ambulatory Visit: Payer: Self-pay

## 2017-02-21 ENCOUNTER — Encounter (HOSPITAL_BASED_OUTPATIENT_CLINIC_OR_DEPARTMENT_OTHER): Payer: Self-pay | Admitting: Emergency Medicine

## 2017-02-21 ENCOUNTER — Encounter: Payer: Self-pay | Admitting: Hematology & Oncology

## 2017-02-21 ENCOUNTER — Inpatient Hospital Stay: Payer: Medicare Other

## 2017-02-21 ENCOUNTER — Inpatient Hospital Stay (HOSPITAL_BASED_OUTPATIENT_CLINIC_OR_DEPARTMENT_OTHER): Payer: Medicare Other | Admitting: Hematology & Oncology

## 2017-02-21 ENCOUNTER — Emergency Department (HOSPITAL_BASED_OUTPATIENT_CLINIC_OR_DEPARTMENT_OTHER): Payer: Medicare Other

## 2017-02-21 VITALS — BP 95/56 | HR 96 | Temp 97.5°F | Wt 190.1 lb

## 2017-02-21 DIAGNOSIS — C4A9 Merkel cell carcinoma, unspecified: Secondary | ICD-10-CM | POA: Diagnosis not present

## 2017-02-21 DIAGNOSIS — C7951 Secondary malignant neoplasm of bone: Secondary | ICD-10-CM | POA: Diagnosis not present

## 2017-02-21 DIAGNOSIS — Z79899 Other long term (current) drug therapy: Secondary | ICD-10-CM | POA: Diagnosis not present

## 2017-02-21 DIAGNOSIS — R0602 Shortness of breath: Secondary | ICD-10-CM

## 2017-02-21 DIAGNOSIS — L89151 Pressure ulcer of sacral region, stage 1: Secondary | ICD-10-CM | POA: Diagnosis present

## 2017-02-21 DIAGNOSIS — K5903 Drug induced constipation: Secondary | ICD-10-CM | POA: Diagnosis present

## 2017-02-21 DIAGNOSIS — C7801 Secondary malignant neoplasm of right lung: Secondary | ICD-10-CM | POA: Diagnosis present

## 2017-02-21 DIAGNOSIS — C787 Secondary malignant neoplasm of liver and intrahepatic bile duct: Secondary | ICD-10-CM | POA: Diagnosis not present

## 2017-02-21 DIAGNOSIS — Z87891 Personal history of nicotine dependence: Secondary | ICD-10-CM

## 2017-02-21 DIAGNOSIS — R21 Rash and other nonspecific skin eruption: Secondary | ICD-10-CM | POA: Diagnosis present

## 2017-02-21 DIAGNOSIS — C7800 Secondary malignant neoplasm of unspecified lung: Secondary | ICD-10-CM

## 2017-02-21 DIAGNOSIS — N179 Acute kidney failure, unspecified: Secondary | ICD-10-CM

## 2017-02-21 DIAGNOSIS — E871 Hypo-osmolality and hyponatremia: Secondary | ICD-10-CM | POA: Diagnosis present

## 2017-02-21 DIAGNOSIS — C799 Secondary malignant neoplasm of unspecified site: Secondary | ICD-10-CM

## 2017-02-21 DIAGNOSIS — Z79818 Long term (current) use of other agents affecting estrogen receptors and estrogen levels: Secondary | ICD-10-CM

## 2017-02-21 DIAGNOSIS — E860 Dehydration: Secondary | ICD-10-CM | POA: Diagnosis present

## 2017-02-21 DIAGNOSIS — C7802 Secondary malignant neoplasm of left lung: Secondary | ICD-10-CM | POA: Diagnosis present

## 2017-02-21 DIAGNOSIS — Z9109 Other allergy status, other than to drugs and biological substances: Secondary | ICD-10-CM

## 2017-02-21 DIAGNOSIS — C4A71 Merkel cell carcinoma of right lower limb, including hip: Secondary | ICD-10-CM | POA: Diagnosis present

## 2017-02-21 DIAGNOSIS — T50995A Adverse effect of other drugs, medicaments and biological substances, initial encounter: Secondary | ICD-10-CM | POA: Diagnosis present

## 2017-02-21 DIAGNOSIS — R600 Localized edema: Secondary | ICD-10-CM

## 2017-02-21 DIAGNOSIS — E861 Hypovolemia: Secondary | ICD-10-CM

## 2017-02-21 DIAGNOSIS — G8929 Other chronic pain: Secondary | ICD-10-CM | POA: Diagnosis present

## 2017-02-21 DIAGNOSIS — C779 Secondary and unspecified malignant neoplasm of lymph node, unspecified: Secondary | ICD-10-CM | POA: Diagnosis not present

## 2017-02-21 DIAGNOSIS — E874 Mixed disorder of acid-base balance: Secondary | ICD-10-CM | POA: Diagnosis present

## 2017-02-21 DIAGNOSIS — R5383 Other fatigue: Secondary | ICD-10-CM

## 2017-02-21 DIAGNOSIS — R627 Adult failure to thrive: Principal | ICD-10-CM | POA: Diagnosis present

## 2017-02-21 DIAGNOSIS — Z90710 Acquired absence of both cervix and uterus: Secondary | ICD-10-CM

## 2017-02-21 DIAGNOSIS — L8990 Pressure ulcer of unspecified site, unspecified stage: Secondary | ICD-10-CM

## 2017-02-21 DIAGNOSIS — R0609 Other forms of dyspnea: Secondary | ICD-10-CM

## 2017-02-21 DIAGNOSIS — R6251 Failure to thrive (child): Secondary | ICD-10-CM

## 2017-02-21 DIAGNOSIS — I951 Orthostatic hypotension: Secondary | ICD-10-CM | POA: Diagnosis present

## 2017-02-21 DIAGNOSIS — R0600 Dyspnea, unspecified: Secondary | ICD-10-CM

## 2017-02-21 DIAGNOSIS — R609 Edema, unspecified: Secondary | ICD-10-CM

## 2017-02-21 DIAGNOSIS — D649 Anemia, unspecified: Secondary | ICD-10-CM

## 2017-02-21 LAB — CBC
HEMATOCRIT: 35.3 % — AB (ref 36.0–46.0)
Hemoglobin: 11.9 g/dL — ABNORMAL LOW (ref 12.0–15.0)
MCH: 27.4 pg (ref 26.0–34.0)
MCHC: 33.7 g/dL (ref 30.0–36.0)
MCV: 81.1 fL (ref 78.0–100.0)
PLATELETS: 89 10*3/uL — AB (ref 150–400)
RBC: 4.35 MIL/uL (ref 3.87–5.11)
RDW: 17.1 % — AB (ref 11.5–15.5)
WBC: 12.5 10*3/uL — AB (ref 4.0–10.5)

## 2017-02-21 LAB — COMPREHENSIVE METABOLIC PANEL
ALBUMIN: 2.9 g/dL — AB (ref 3.5–5.0)
ALK PHOS: 491 U/L — AB (ref 26–84)
ALT: 51 U/L — ABNORMAL HIGH (ref 10–47)
ANION GAP: 13 (ref 5–15)
AST: 96 U/L — ABNORMAL HIGH (ref 11–38)
BILIRUBIN TOTAL: 1.3 mg/dL (ref 0.2–1.6)
BUN: 44 mg/dL — ABNORMAL HIGH (ref 7–22)
CALCIUM: 7.4 mg/dL — AB (ref 8.0–10.3)
CHLORIDE: 93 mmol/L — AB (ref 98–108)
CO2: 24 mmol/L (ref 18–33)
CREATININE: 1.3 mg/dL — AB (ref 0.60–1.20)
Glucose, Bld: 101 mg/dL (ref 73–118)
Potassium: 4.2 mmol/L (ref 3.3–4.7)
Sodium: 130 mmol/L (ref 128–145)
Total Protein: 6.3 g/dL — ABNORMAL LOW (ref 6.4–8.1)

## 2017-02-21 LAB — URINALYSIS, ROUTINE W REFLEX MICROSCOPIC
Glucose, UA: NEGATIVE mg/dL
Hgb urine dipstick: NEGATIVE
Ketones, ur: NEGATIVE mg/dL
NITRITE: NEGATIVE
PROTEIN: NEGATIVE mg/dL
SPECIFIC GRAVITY, URINE: 1.018 (ref 1.005–1.030)
pH: 5 (ref 5.0–8.0)

## 2017-02-21 LAB — CBC WITH DIFFERENTIAL (CANCER CENTER ONLY)
BASOS PCT: 0 %
Basophils Absolute: 0 10*3/uL (ref 0.0–0.1)
Eosinophils Absolute: 0 10*3/uL (ref 0.0–0.5)
Eosinophils Relative: 0 %
HEMATOCRIT: 36.1 % (ref 34.8–46.6)
HEMOGLOBIN: 12.3 g/dL (ref 11.6–15.9)
LYMPHS ABS: 1.2 10*3/uL (ref 0.9–3.3)
Lymphocytes Relative: 10 %
MCH: 28.1 pg (ref 26.0–34.0)
MCHC: 34.1 g/dL (ref 32.0–36.0)
MCV: 82.4 fL (ref 81.0–101.0)
MONOS PCT: 5 %
Monocytes Absolute: 0.6 10*3/uL (ref 0.1–0.9)
NEUTROS ABS: 10.5 10*3/uL — AB (ref 1.5–6.5)
NEUTROS PCT: 85 %
Platelet Count: 94 10*3/uL — ABNORMAL LOW (ref 145–400)
RBC: 4.38 MIL/uL (ref 3.70–5.32)
RDW: 17.8 % — ABNORMAL HIGH (ref 11.1–15.7)
WBC: 12.4 10*3/uL — AB (ref 3.9–10.0)

## 2017-02-21 LAB — TROPONIN I
TROPONIN I: 0.03 ng/mL — AB (ref <0.03)
Troponin I: 0.04 ng/mL (ref <0.03)

## 2017-02-21 LAB — LACTATE DEHYDROGENASE: LDH: 1952 U/L — ABNORMAL HIGH (ref 125–245)

## 2017-02-21 LAB — CREATININE, SERUM
CREATININE: 1.05 mg/dL — AB (ref 0.44–1.00)
GFR, EST NON AFRICAN AMERICAN: 53 mL/min — AB (ref >60)

## 2017-02-21 LAB — BRAIN NATRIURETIC PEPTIDE: B NATRIURETIC PEPTIDE 5: 76.4 pg/mL (ref 0.0–100.0)

## 2017-02-21 LAB — TSH: TSH: 3.667 u[IU]/mL (ref 0.308–3.960)

## 2017-02-21 MED ORDER — HEPARIN SOD (PORK) LOCK FLUSH 100 UNIT/ML IV SOLN
250.0000 [IU] | INTRAVENOUS | Status: DC | PRN
  Filled 2017-02-21: qty 2.5

## 2017-02-21 MED ORDER — ZOLPIDEM TARTRATE 5 MG PO TABS
5.0000 mg | ORAL_TABLET | Freq: Every evening | ORAL | Status: DC | PRN
  Administered 2017-02-21 – 2017-02-23 (×3): 5 mg via ORAL
  Filled 2017-02-21 (×3): qty 1

## 2017-02-21 MED ORDER — OXYCODONE HCL ER 15 MG PO T12A
15.0000 mg | EXTENDED_RELEASE_TABLET | Freq: Two times a day (BID) | ORAL | Status: DC
  Administered 2017-02-21 – 2017-02-24 (×6): 15 mg via ORAL
  Filled 2017-02-21 (×6): qty 1

## 2017-02-21 MED ORDER — HEPARIN SOD (PORK) LOCK FLUSH 100 UNIT/ML IV SOLN
500.0000 [IU] | Freq: Every day | INTRAVENOUS | Status: DC | PRN
  Filled 2017-02-21: qty 5

## 2017-02-21 MED ORDER — LACTULOSE 10 GM/15ML PO SOLN: 10.0000 g | ORAL | Status: DC | PRN

## 2017-02-21 MED ORDER — HEPARIN SODIUM (PORCINE) 5000 UNIT/ML IJ SOLN
5000.0000 [IU] | Freq: Three times a day (TID) | INTRAMUSCULAR | Status: DC
  Administered 2017-02-21 – 2017-02-22 (×4): 5000 [IU] via SUBCUTANEOUS
  Filled 2017-02-21 (×5): qty 1

## 2017-02-21 MED ORDER — ACETAMINOPHEN 650 MG RE SUPP: 650.0000 mg | Freq: Four times a day (QID) | RECTAL | Status: DC | PRN

## 2017-02-21 MED ORDER — PREDNISONE 20 MG PO TABS: 60.0000 mg | ORAL_TABLET | Freq: Every day | ORAL | Status: DC

## 2017-02-21 MED ORDER — SODIUM CHLORIDE 0.9 % IV SOLN
INTRAVENOUS | Status: DC
  Administered 2017-02-21 – 2017-02-22 (×2): via INTRAVENOUS

## 2017-02-21 MED ORDER — ONDANSETRON HCL 4 MG PO TABS: 4.0000 mg | ORAL_TABLET | Freq: Four times a day (QID) | ORAL | Status: DC | PRN

## 2017-02-21 MED ORDER — PREDNISONE 20 MG PO TABS
60.0000 mg | ORAL_TABLET | Freq: Every day | ORAL | Status: DC
  Administered 2017-02-21: 60 mg via ORAL
  Filled 2017-02-21 (×3): qty 3

## 2017-02-21 MED ORDER — TIZANIDINE HCL 4 MG PO TABS
4.0000 mg | ORAL_TABLET | Freq: Four times a day (QID) | ORAL | Status: DC | PRN
  Administered 2017-02-23: 4 mg via ORAL
  Filled 2017-02-21: qty 1

## 2017-02-21 MED ORDER — ONDANSETRON HCL 4 MG/2ML IJ SOLN: 4.0000 mg | Freq: Four times a day (QID) | INTRAMUSCULAR | Status: DC | PRN

## 2017-02-21 MED ORDER — SODIUM CHLORIDE 0.9 % IV BOLUS (SEPSIS)
500.0000 mL | Freq: Once | INTRAVENOUS | Status: AC
  Administered 2017-02-21: 500 mL via INTRAVENOUS

## 2017-02-21 MED ORDER — LORAZEPAM 0.5 MG PO TABS
0.5000 mg | ORAL_TABLET | Freq: Four times a day (QID) | ORAL | Status: DC | PRN
  Administered 2017-02-23: 0.5 mg via ORAL
  Filled 2017-02-21: qty 1

## 2017-02-21 MED ORDER — MEGESTROL ACETATE 400 MG/10ML PO SUSP
400.0000 mg | Freq: Two times a day (BID) | ORAL | Status: DC
  Administered 2017-02-21 – 2017-02-24 (×6): 400 mg via ORAL
  Filled 2017-02-21 (×8): qty 10

## 2017-02-21 MED ORDER — SODIUM CHLORIDE 0.9% FLUSH: 10.0000 mL | INTRAVENOUS | Status: DC | PRN

## 2017-02-21 MED ORDER — OXYCODONE ER 13.5 MG PO C12A
13.5000 mg | EXTENDED_RELEASE_CAPSULE | Freq: Two times a day (BID) | ORAL | Status: DC
Start: 2017-02-21 — End: 2017-02-21

## 2017-02-21 MED ORDER — ASPIRIN 81 MG PO CHEW
324.0000 mg | CHEWABLE_TABLET | Freq: Once | ORAL | Status: AC
  Administered 2017-02-21: 324 mg via ORAL
  Filled 2017-02-21: qty 4

## 2017-02-21 MED ORDER — SODIUM CHLORIDE 0.9% FLUSH: 3.0000 mL | INTRAVENOUS | Status: DC | PRN

## 2017-02-21 MED ORDER — ACETAMINOPHEN 325 MG PO TABS
650.0000 mg | ORAL_TABLET | Freq: Four times a day (QID) | ORAL | Status: DC | PRN
  Administered 2017-02-23: 650 mg via ORAL
  Filled 2017-02-21: qty 2

## 2017-02-21 MED ORDER — SODIUM CHLORIDE 0.9% FLUSH
10.0000 mL | INTRAVENOUS | Status: DC | PRN
  Administered 2017-02-22: 20 mL
  Administered 2017-02-23: 10 mL
  Filled 2017-02-21 (×2): qty 40

## 2017-02-21 NOTE — ED Triage Notes (Signed)
Pt sent from dr Vickey Sages office to be admitted to [facility] long.  Pt has some bilateral leg swelling with red rash, and sob.  No resp distress noted.

## 2017-02-21 NOTE — ED Notes (Signed)
Report called to Denton Ar, Therapist, sports at [facility]

## 2017-02-21 NOTE — ED Notes (Signed)
[facility] here

## 2017-02-21 NOTE — ED Provider Notes (Signed)
[facility] Provider Note   CSN: [id number] Arrival date & time: 02/21/17  1031     History   Chief Complaint Chief Complaint  Patient presents with  . Shortness of Breath  . Leg Swelling    HPI [name] [name] is a 73 y.o. female.  HPI   Fatigue, over the last 3 weeks, started treatment 2 weeks ago, rapidly declined  Rash bilateral legs from her treatment, began on Monday Bilateral lower extremity edema, leg swelling has been present for a few weeks, here before and had [place] for it Feels exhausted after just walking to the bathroom 10-93ft, shortness of breath on exertion, very mild sometimes at rest, has been present for last 3 weeks, worsening over last week, low energy No fever. Mild cough sometimes, thinks side effect.  Can't get comfortable, taking oxycodone as rx and pain is under better control Presents with some nausea, has nausea medicines, taking appetite medications, taking in small amounts of jello, pudding, not eating much, doesn't have much energy.  Dr. [facility] sent here to ED with concern for low blood pressures, failure to thrive, leg swelling, concern for dehydration but also concern for leg swelling and recommended admission to [facility] for further care.  Past Medical History:  Diagnosis Date  . Arthritis   . Depression   . GERD (gastroesophageal reflux disease)   . Hyperlipidemia   . 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 Osteoporosis   . Vitamin D deficiency     Patient Active Problem List   Diagnosis Date Noted  . Failure to thrive (0-17) 02/21/2017  . DOE (dyspnea on exertion) 02/21/2017  . Anxiety in acute stress reaction 01/14/2017  . Merkel cell carcinoma ([place]) 01/02/2017  . Elevated glucose 01/24/2016  . Counseling regarding goals of care 12/28/2014  . Encounter for Medicare annual wellness exam 11/11/2013  . Colon cancer screening 09/18/2011  . Vitamin D deficiency 02/16/2009  .  INSOMNIA, CHRONIC 12/14/2008  . VARICOSE VEINS, LOWER EXTREMITIES 12/14/2008  . LEG PAIN, BILATERAL 12/14/2008  . Hyperlipidemia 03/11/2007  . Osteoporosis 03/11/2007  . TOBACCO USE, QUIT 03/11/2007  . OTHER CHRONIC PAIN 02/25/2007  . GERD 02/25/2007    Past Surgical History:  Procedure Laterality Date  . ABDOMINAL HYSTERECTOMY    . IR FLUORO GUIDE PORT INSERTION RIGHT  02/05/2017  . IR US GUIDE VASC ACCESS RIGHT  02/05/2017  . SHOULDER SURGERY     right    OB History    No data available       Home Medications    Prior to Admission medications   Medication Sig Start Date End Date Taking? Authorizing Provider  Calcium Carb-Cholecalciferol (CALCIUM 600 + D PO) Take 2 tablets by mouth daily.   Yes [provider]  cholecalciferol (VITAMIN D) 1000 UNITS tablet Take 5,000 Units by mouth daily.    Yes [provider]  lactulose (CHRONULAC) 10 GM/15ML solution Take 15 mLs (10 g total) by mouth every 4 (four) hours as needed for severe constipation. 01/21/17  Yes Ennever, Rudell Cobb, MD  LORazepam (ATIVAN) 0.5 MG tablet Take 1 tablet (0.5 mg total) by mouth every 6 (six) hours as needed (Nausea or vomiting). 02/07/17  Yes Volanda Napoleon, MD  megestrol (MEGACE) 400 MG/10ML suspension Take 10 mLs (400 mg total) by mouth 2 (two) times daily. 02/01/17  Yes Volanda Napoleon, MD  methylPREDNISolone (MEDROL DOSEPAK) 4 MG TBPK tablet Take per instructions on the box  02/19/17  Yes Ennever, Rudell Cobb, MD  ondansetron (ZOFRAN) 8 MG tablet Take 1 tablet (8 mg total) by mouth 2 (two) times daily as needed (Nausea or vomiting). 02/07/17  Yes Volanda Napoleon, MD  OxyCODONE ER [facility]) 13.5 MG C12A Take 13.5 mg by mouth every 12 (twelve) hours. 02/01/17  Yes Ennever, Rudell Cobb, MD  tiZANidine (ZANAFLEX) 4 MG tablet Take 1 tablet (4 mg total) by mouth every 6 (six) hours as needed for muscle spasms. Caution of sedation 01/18/17  Yes Tower, Roque Lias A, MD  zolpidem (AMBIEN) 10 MG tablet TAKE 1  TABLET BY MOUTH AT BEDTIME AS NEEDED FOR SLEEP Patient taking differently: TAKE 1 TABLET (10mg ) BY MOUTH AT BEDTIME AS NEEDED FOR SLEEP 11/23/16  Yes Tower, Wynelle Fanny, MD  lidocaine-prilocaine (EMLA) cream Apply to affected area once 02/07/17   Ennever, Rudell Cobb, MD  oxyCODONE 10 MG TABS Take 1 tablet (10 mg total) by mouth every 6 (six) hours as needed for severe pain. Patient not taking: Reported on 02/21/2017 02/01/17   Volanda Napoleon, MD    Family History Family History  Problem Relation Age of Onset  . Diabetes Father   . Cancer Father        lung cancer  . Cancer Sister        breast cancer  . Cancer Brother        lung cancer  . Coronary artery disease Paternal Uncle   . Cancer Maternal Grandfather        possible prostate  . Coronary artery disease Paternal Grandfather   . Heart disease Paternal Grandfather        CAD    Social History Social History   Tobacco Use  . Smoking status: Former Smoker    Last attempt to quit: 01/22/2005    Years since quitting: 12.0  . Smokeless tobacco: Never Used  Substance Use Topics  . Alcohol use: No    Alcohol/week: 0.0 oz  . Drug use: No     Allergies   Evista [raloxifene hcl]; Fosamax [alendronate sodium]; Gabapentin; and Nsaids   Review of Systems Review of Systems  Constitutional: Positive for appetite change and fatigue. Negative for fever.  HENT: Negative for sore throat.   Eyes: Negative for visual disturbance.  Respiratory: Positive for cough and shortness of breath.   Cardiovascular: Negative for chest pain.  Gastrointestinal: Positive for constipation and nausea. Negative for abdominal pain and diarrhea.  Genitourinary: Negative for difficulty urinating and dysuria.  Musculoskeletal: Negative for back pain and neck pain.  Skin: Negative for rash.  Neurological: Negative for syncope and headaches.     Physical Exam Updated Vital Signs BP 108/60 (BP Location: Left Arm)   Pulse (!) 102   Temp 97.8 F (36.6 C)  (Oral)   Resp 18   Ht 5\' 5"  (1.651 m)   Wt 86 kg (189 lb 9.5 oz)   SpO2 99%   BMI 31.55 kg/m    Physical Exam  Constitutional: She is oriented to person, place, and time. She appears well-developed and well-nourished. No distress.  HENT:  Head: Normocephalic and atraumatic.  Eyes: Conjunctivae and EOM are normal.  Neck: Normal range of motion.  Cardiovascular: Normal rate, regular rhythm, normal heart sounds and intact distal pulses. Exam reveals no gallop and no friction rub.  No murmur heard. Pulmonary/Chest: Effort normal and breath sounds normal. No respiratory distress. She has no wheezes. She has no rales.  Abdominal: Soft. She exhibits no distension.  There is no tenderness. There is no guarding.  Musculoskeletal: She exhibits no edema or tenderness.  Neurological: She is alert and oriented to person, place, and time.  Skin: Skin is warm and dry. No rash noted. She is not diaphoretic. No erythema.  Nursing note and vitals reviewed.    ED Treatments / Results  Labs (all labs ordered are listed, but only abnormal results are displayed) Labs Reviewed  TROPONIN I - Abnormal; Notable for the following components:      Result Value   Troponin I 0.03 (*)    All other components within normal limits  BRAIN NATRIURETIC PEPTIDE  CBC  CREATININE, SERUM  TROPONIN I  TROPONIN I  TROPONIN I  COMPREHENSIVE METABOLIC PANEL  CBC    EKG  EKG Interpretation  Date/Time:  Thursday February 21 2017 11:42:07 EST Ventricular Rate:  103 PR Interval:    QRS Duration: 95 QT Interval:  334 QTC Calculation: 438 R Axis:   66 Text Interpretation:  Sinus tachycardia Minimal ST depression, inferior leads No significant change since last tracing Confirmed by Gareth Morgan [phone]) on 02/21/2017 1:31:11 PM       Radiology Dg Chest 2 View  Result Date: 02/21/2017 CLINICAL DATA:  Shortness of breath. EXAM: CHEST  2 VIEW COMPARISON:  02/14/2017 PET-CT 01/30/2017. FINDINGS: PowerPort  catheter noted with tip over the superior vena cava. Heart size normal. Multiple pulmonary nodular densities are again noted consistent patient's known metastatic disease. Persistent mild bibasilar subsegmental atelectasis. Heart size normal. IMPRESSION: 1.  PowerPort catheter stable position. 2. Diffuse bilateral pulmonary nodular densities are again noted consistent with metastatic disease. Similar findings noted on prior exam. Mild bibasilar subsegmental atelectasis and/or scarring. Electronically Signed   By: Marcello Moores  Register   On: 02/21/2017 12:08    Procedures Procedures (including critical care time)  Medications Ordered in ED Medications  lactulose (CHRONULAC) 10 GM/15ML solution 10 g (not administered)  LORazepam (ATIVAN) tablet 0.5 mg (not administered)  megestrol (MEGACE) 400 MG/10ML suspension 400 mg (not administered)  OxyCODONE ER C12A 13.5 mg (not administered)  tiZANidine (ZANAFLEX) tablet 4 mg (not administered)  zolpidem (AMBIEN) tablet 5 mg (not administered)  heparin injection 5,000 Units (not administered)  acetaminophen (TYLENOL) tablet 650 mg (not administered)    Or  acetaminophen (TYLENOL) suppository 650 mg (not administered)  ondansetron (ZOFRAN) tablet 4 mg (not administered)    Or  ondansetron (ZOFRAN) injection 4 mg (not administered)  aspirin chewable tablet 324 mg (324 mg Oral Given 02/21/17 1448)     Initial Impression / Assessment and Plan / ED Course  I have reviewed the triage vital signs and the nursing notes.  Pertinent labs & imaging results that were available during my care of the patient were reviewed by me and considered in my medical decision making (see chart for details).     Female with a history of aggressive Merkel cell carcinoma presents from Dr. Antonieta Pert office with concern for failure to thrive, leg swelling, generalized weakness, dehydration, and low blood pressures. Patient reports progressive fatigue and dyspnea.   Labs done at  Dr. Antonieta Pert office show mild worsening of creatinine.  History concerning for congestive heart failure given orthopnea, dyspnea on exertion, and bilateral lower extremity edema, however is also concerning for dehydration--patient eating and drinking less, having acute kidney injury, and low energy.  Discussed with patient that differential diagnosis for worsening dyspnea includes worsening metastatic disease, congestive heart failure, or pulmonary embolus.  Discussed my recommendation to order a d-dimer  and potential CT scan to further evaluate for signs of pulmonary embolus given her relative immobilization as well as cancer history, however patient and her husband declined and d-dimer testing.  They report they do not want to put her through additional testing, and she is unable to lay down flat for CT scanning.  They understand that we might miss a diagnosis such as a pulmonary embolus that may be treated with anticoagulation.  She certainly has other reasons for dyspnea, but I discussed that she also has risk factors for PE and I recommend further testing which they decline.  She does not have fevers or infectious symp thetoms to suggest sepsis.    Despite having hx concerning for CHF, her BNP is low.  Suspect her bilateral leg swelling may be secondary to low albumin and retroperitoneal lymphadenopathy, and dyspnea/orthopnea secondary to metastases. Discussed this with family.  I feel she is likely intravascularly dry with other reasons for swelling, Dr. Marin Olp had felt she may be hypovolemic but also may benefit from lasix, but did not feel comfortable with outpatient treatment given her lower blood pressures in the office.  He and family would like admission for further care. Her blood pressures are improved in the ED. She did not have significant hypoxia (was 91%) but symptomatically felt better on O2.  Discussed with patient and family that some of these symptoms may be secondary to worsening cancer.  I feel palliative discussions are appropriate as an inpatient but worry that the patient and her husband do not feel ready for this given her recent good function.  Troponin is elevated and again I discussed further reason to evaluate for PE with ddimer and CT scan however patient and husband decline.  Will admit to tele for continued monitoring, cycling troponins, consider inpt ECHO and gentle hydration.   Final Clinical Impressions(s) / ED Diagnoses   Final diagnoses:  Peripheral edema  Dyspnea, unspecified type  Other fatigue  Metastatic disease ([place])  Hypovolemia    ED Discharge Orders    None      Gareth Morgan, MD 02/21/17 1845

## 2017-02-21 NOTE — ED Notes (Signed)
carelink en route. Report given to [facility] EMT-P

## 2017-02-21 NOTE — Progress Notes (Signed)
Hematology and Oncology Follow Up Visit  [name] [name] [id number] [birth year] 72 y.o. 02/21/2017   Principle Diagnosis:   Metastatic Merkel cell carcinoma-lung, lymph node, and bone metastasis  Current Therapy:    Avelumab - cycle #1 to start on 02/07/2017  Xgeva 120 mg SQ q 3 months     Interim History:  [name] [name] is back for follow-up.  Unfortunately, she is declining.  She developed this macular rash all over her body.  I suspect this probably is from the [place].  She has marked edema in her legs.  She really is not able to walk around.  Her appetite is decreased.  She has had some nausea but no vomiting.  There is been no diarrhea.  She actually is a little constipated.  She is had no cough.  She says that when she tries to get up, she gets incredibly short of breath.  The swelling in her legs has been evaluated previously with a Doppler.  This was negative for any thromboembolic disease.  There is been no bleeding.  She has had no mouth sores.  Currently, her performance status is ECOG 2-3.  Medications:  Current Outpatient Medications:  .  busPIRone (BUSPAR) 30 MG tablet, Take 0.5 tablets (15 mg total) by mouth 2 (two) times daily., Disp: 30 tablet, Rfl: 3 .  Calcium Carb-Cholecalciferol (CALCIUM 600 + D PO), Take 2 tablets by mouth daily., Disp: , Rfl:  .  cholecalciferol (VITAMIN D) 1000 UNITS tablet, Take 5,000 Units by mouth daily. , Disp: , Rfl:  .  lactulose (CHRONULAC) 10 GM/15ML solution, Take 15 mLs (10 g total) by mouth every 4 (four) hours as needed for severe constipation., Disp: 240 mL, Rfl: 0 .  lidocaine-prilocaine (EMLA) cream, Apply to affected area once, Disp: 30 g, Rfl: 3 .  LORazepam (ATIVAN) 0.5 MG tablet, Take 1 tablet (0.5 mg total) by mouth every 6 (six) hours as needed (Nausea or vomiting)., Disp: 30 tablet, Rfl: 0 .  megestrol (MEGACE) 400 MG/10ML suspension, Take 10 mLs (400 mg total) by mouth 2 (two) times daily., Disp: 480 mL, Rfl: 3 .   methylPREDNISolone (MEDROL DOSEPAK) 4 MG TBPK tablet, Take per instructions on the box, Disp: 21 tablet, Rfl: 0 .  ondansetron (ZOFRAN) 8 MG tablet, Take 1 tablet (8 mg total) by mouth 2 (two) times daily as needed (Nausea or vomiting)., Disp: 30 tablet, Rfl: 1 .  oxyCODONE 10 MG TABS, Take 1 tablet (10 mg total) by mouth every 6 (six) hours as needed for severe pain., Disp: 90 tablet, Rfl: 0 .  OxyCODONE ER (XTAMPZA ER) 13.5 MG C12A, Take 13.5 mg by mouth every 12 (twelve) hours., Disp: 60 each, Rfl: 0 .  predniSONE (STERAPRED UNI-PAK 21 TAB) 10 MG (21) TBPK tablet, Take 6 tablets on day 1 Take 5 tablets on day 2 Take 4 tablets on day 3 Take 3 tablets on day 4 Take 2 tablets on day 5 Take 1 tablet on day 6, Disp: 21 tablet, Rfl: 0 .  prochlorperazine (COMPAZINE) 10 MG tablet, Take 1 tablet (10 mg total) by mouth every 6 (six) hours as needed (Nausea or vomiting)., Disp: 30 tablet, Rfl: 1 .  promethazine (PHENERGAN) 25 MG tablet, Take 1 tablet (25 mg total) by mouth every 8 (eight) hours as needed for nausea or vomiting., Disp: 20 tablet, Rfl: 0 .  tiZANidine (ZANAFLEX) 4 MG tablet, Take 1 tablet (4 mg total) by mouth every 6 (six) hours as needed for muscle spasms.  Caution of sedation, Disp: 30 tablet, Rfl: 1 .  traMADol (ULTRAM) 50 MG tablet, TAKE 1 TABLET BY MOUTH EVERY 8 HOURS AS NEEDED., Disp: 30 tablet, Rfl: 0 .  zolpidem (AMBIEN) 10 MG tablet, TAKE 1 TABLET BY MOUTH AT BEDTIME AS NEEDED FOR SLEEP (Patient taking differently: TAKE 1 TABLET (10mg ) BY MOUTH AT BEDTIME AS NEEDED FOR SLEEP), Disp: 30 tablet, Rfl: 3  Allergies:  Allergies  Allergen Reactions  . Evista [Raloxifene Hcl]     cramps  . Fosamax [Alendronate Sodium]     GI intolerance   . Gabapentin     REACTION: nausea  . Nsaids     REACTION: GI upset    Past Medical History, Surgical history, Social history, and Family History were reviewed and updated.  Review of Systems: Review of Systems  Constitutional: Positive for  fatigue and unexpected weight change.  HENT:  Negative.   Eyes: Negative.   Respiratory: Negative.   Cardiovascular: Positive for leg swelling.  Gastrointestinal: Positive for constipation.  Endocrine: Negative.   Genitourinary: Negative.    Musculoskeletal: Positive for back pain.  Skin: Negative.   Neurological: Negative.   Hematological: Negative.   Psychiatric/Behavioral: Negative.     Physical Exam:  weight is 190 lb 1.9 oz (86.2 kg). Her oral temperature is 97.5 F (36.4 C) (abnormal). Her blood pressure is 95/56 (abnormal) and her pulse is 96. Her oxygen saturation is 99%.   Wt Readings from Last 3 Encounters:  02/21/17 190 lb 1.9 oz (86.2 kg)  02/05/17 186 lb (84.4 kg)  02/01/17 189 lb (85.7 kg)    Physical Exam  Constitutional: She is oriented to person, place, and time.  HENT:  Head: Normocephalic and atraumatic.  Mouth/Throat: Oropharynx is clear and moist.  Eyes: EOM are normal. Pupils are equal, round, and reactive to light.  Neck: Normal range of motion.  Cardiovascular: Normal rate, regular rhythm and normal heart sounds.  Pulmonary/Chest: Effort normal and breath sounds normal.  Abdominal: Soft. Bowel sounds are normal.  Musculoskeletal: Normal range of motion. She exhibits no edema, tenderness or deformity.  Lymphadenopathy:    She has no cervical adenopathy.  Neurological: She is alert and oriented to person, place, and time.  Skin: Skin is warm and dry. No rash noted. No erythema.  Psychiatric: She has a normal mood and affect. Her behavior is normal. Judgment and thought content normal.  Vitals reviewed.    Lab Results  Component Value Date   WBC 12.4 (H) 02/21/2017   HGB 10.4 (L) 02/05/2017   HCT 36.1 02/21/2017   MCV 82.4 02/21/2017   PLT 94 (L) 02/21/2017     Chemistry      Component Value Date/Time   NA 130 02/21/2017 0905   NA 144 01/02/2017 1506   K 4.2 02/21/2017 0905   K 4.0 01/02/2017 1506   CL 93 (L) 02/21/2017 0905   CL 100  01/02/2017 1506   CO2 24 02/21/2017 0905   CO2 31 01/02/2017 1506   BUN 44 (H) 02/21/2017 0905   BUN 9 01/02/2017 1506   CREATININE 1.30 (H) 02/21/2017 0905   CREATININE 0.9 01/02/2017 1506      Component Value Date/Time   CALCIUM 7.4 (L) 02/21/2017 0905   CALCIUM 9.7 01/02/2017 1506   ALKPHOS 491 (H) 02/21/2017 0905   ALKPHOS 86 (H) 01/02/2017 1506   AST 96 (H) 02/21/2017 0905   AST 96 (H) 02/14/2017 1145   ALT 51 (H) 02/21/2017 0905   ALT 40 02/14/2017  1145   ALT 23 01/02/2017 1506   BILITOT 1.3 02/21/2017 0905   BILITOT 1.1 02/14/2017 1145         Impression and Plan: Ms. Arseneau is a 72 year old white female.  She has metastatic Merkel cell carcinoma.  This is clearly an aggressive process.  Unfortunately, I think that this rash clearly is from the [place].  I do have her on a Medrol dose pack.  I think we probably will have to increase this significantly.  Her LFTs are a little bit elevated.  This also could be from the [place].   I feel that she probably is going to have to be admitted.  I think that we are going to have to see about giving her IV Lasix to try to get the edema out of her legs.  If she has significant adenopathy in the abdomen/pelvis this probably is going to make it difficult to get fluid out of her legs.  I just am not sure we are going to be able to treat her again with immunotherapy.  I do not know if we will be able to treat her  In the future with any intervention.  Her quality of life and performance status just have declined significantly.  She definitely needs to improve before I would consider any therapeutic intervention.  I spent 40 minutes with she and her husband.  Over 50% of the time was face-to-face.  We reviewed the labs.  I explained why I felt she had to be admitted.  I spoke to the emergency room doctor.  They will take her and be able to get her admitted from the emergency room.  I will follow her up in the hospital.    Volanda Napoleon, MD 1/31/201910:28 AM

## 2017-02-21 NOTE — Progress Notes (Signed)
CRITICAL VALUE ALERT  Critical Value:  Troponin 0.04  Date & Time Notied:  02-21-2017 2055  Provider Notified: Alger Memos NP  Orders Received/Actions taken:

## 2017-02-21 NOTE — H&P (Addendum)
History and Physical    FRANCHELLE FOSKETT MIW:[id number] DOB: 1946/01/15 DOA: 02/21/2017  PCP: Abner Greenspan, MD  Patient coming from: home  I have personally briefly reviewed patient's old medical records in [place]  Chief Complaint: failure to thrive  HPI: [name] [name] is a 72 y.o. 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  Mrs. Mcavoy notes that her symptoms started a few weeks ago.  She has been feeling like she has been losing her strength over the past couple weeks.  She notes decreased appetite over this period of time.  She also notes increased shortness of breath with exertion and lower extremity edema over the past couple weeks.  Lower extremity edema is worse on the right side.  She saw Dr. Marin Olp an appointment today and due to her general decline he recommended admission.  She developed a rash a few days ago which Dr. Marin Olp thinks is due to the avelumab.  She denies any pain.  She denies any chest pain, abdominal pain, vomiting.  She does note some nausea.  Regarding her shortness of breath, she notes orthopnea, but this is chronic.  She has been sleeping on 3-4 pillows for a long time.  She denies any paroxysmal nocturnal dyspnea.  She notes that her swelling has gotten worse in her legs over the past few days and weeks.  She feels like she is carrying a stack of bricks when she is walking because of this.  She denies any pain to her lower extremities.  ED Course: Labs, imaging, EKG.  Discussed CTA and d dimer, but pt declined.  Admit for lower extremity edema and FTT.   Review of Systems: As per HPI otherwise 10 point review of systems negative.   Past Medical History:  Diagnosis Date  . Arthritis   . Depression   . GERD (gastroesophageal reflux disease)   . Hyperlipidemia   . Merkel cell carcinoma  of face ([place]) 01/02/2017  . Osteoporosis   . Vitamin D deficiency     Past Surgical History:  Procedure Laterality Date  . ABDOMINAL HYSTERECTOMY    . IR FLUORO GUIDE PORT INSERTION RIGHT  02/05/2017  . IR US GUIDE VASC ACCESS RIGHT  02/05/2017  . SHOULDER SURGERY     right     reports that she quit smoking about 12 years ago. she has never used smokeless tobacco. She reports that she does not drink alcohol or use drugs.  Allergies  Allergen Reactions  . Evista [Raloxifene Hcl]     cramps  . Fosamax [Alendronate Sodium]     GI intolerance   . Gabapentin     REACTION: nausea  . Nsaids     REACTION: GI upset    Family History  Problem Relation Age of Onset  . Diabetes Father   . Cancer Father        lung cancer  . Cancer Sister        breast cancer  . Cancer Brother        lung cancer  . Coronary artery disease Paternal Uncle   . Cancer Maternal Grandfather        possible prostate  . Coronary artery disease Paternal Grandfather   . Heart disease Paternal Grandfather        CAD   Prior to Admission medications   Medication  Sig Start Date End Date Taking? Authorizing Provider  Calcium Carb-Cholecalciferol (CALCIUM 600 + D PO) Take 2 tablets by mouth daily.   Yes [provider]  cholecalciferol (VITAMIN D) 1000 UNITS tablet Take 5,000 Units by mouth daily.    Yes [provider]  lactulose (CHRONULAC) 10 GM/15ML solution Take 15 mLs (10 g total) by mouth every 4 (four) hours as needed for severe constipation. 01/21/17  Yes Ennever, Rudell Cobb, MD  LORazepam (ATIVAN) 0.5 MG tablet Take 1 tablet (0.5 mg total) by mouth every 6 (six) hours as needed (Nausea or vomiting). 02/07/17  Yes Volanda Napoleon, MD  megestrol (MEGACE) 400 MG/10ML suspension Take 10 mLs (400 mg total) by mouth 2 (two) times daily. 02/01/17  Yes Volanda Napoleon, MD  methylPREDNISolone (MEDROL DOSEPAK) 4 MG TBPK tablet Take per instructions on the box 02/19/17  Yes Ennever, Rudell Cobb, MD    ondansetron (ZOFRAN) 8 MG tablet Take 1 tablet (8 mg total) by mouth 2 (two) times daily as needed (Nausea or vomiting). 02/07/17  Yes Volanda Napoleon, MD  OxyCODONE ER [facility] ER) 13.5 MG C12A Take 13.5 mg by mouth every 12 (twelve) hours. 02/01/17  Yes Ennever, Rudell Cobb, MD  tiZANidine (ZANAFLEX) 4 MG tablet Take 1 tablet (4 mg total) by mouth every 6 (six) hours as needed for muscle spasms. Caution of sedation 01/18/17  Yes Tower, Roque Lias A, MD  zolpidem (AMBIEN) 10 MG tablet TAKE 1 TABLET BY MOUTH AT BEDTIME AS NEEDED FOR SLEEP Patient taking differently: TAKE 1 TABLET (39m) BY MOUTH AT BEDTIME AS NEEDED FOR SLEEP 11/23/16  Yes Tower, MWynelle Fanny MD  lidocaine-prilocaine (EMLA) cream Apply to affected area once 02/07/17   Ennever, PRudell Cobb MD  oxyCODONE 10 MG TABS Take 1 tablet (10 mg total) by mouth every 6 (six) hours as needed for severe pain. Patient not taking: Reported on 02/21/2017 02/01/17   EVolanda Napoleon MD    Physical Exam: Vitals:   02/21/17 1430 02/21/17 1530 02/21/17 1551 02/21/17 1652  BP: 110/66 103/65 103/65 108/60  Pulse: 97 97 98 (!) 102  Resp: 17 16 16 18   Temp:   98 F (36.7 C) 97.8 F (36.6 C)  TempSrc:    Oral  SpO2: 99% 98% 98% 99%  Weight:    86 kg (189 lb 9.5 oz)  Height:    5' 5"  (1.651 m)    Constitutional: NAD, calm, comfortable Vitals:   02/21/17 1430 02/21/17 1530 02/21/17 1551 02/21/17 1652  BP: 110/66 103/65 103/65 108/60  Pulse: 97 97 98 (!) 102  Resp: 17 16 16 18   Temp:   98 F (36.7 C) 97.8 F (36.6 C)  TempSrc:    Oral  SpO2: 99% 98% 98% 99%  Weight:    86 kg (189 lb 9.5 oz)  Height:    5' 5"  (1.651 m)   Eyes: PERRL, lids and conjunctivae normal ENMT: Mucous membranes are moist. Posterior pharynx clear of any exudate or lesions.Normal dentition.  Neck: normal, supple, no masses, no thyromegaly Respiratory: clear to auscultation bilaterally, no wheezing, no crackles. Normal respiratory effort. No accessory muscle use.  Cardiovascular:  Regular rate and rhythm, no murmurs / rubs / gallops. 2+ pedal pulses. No carotid bruits.  Abdomen: no tenderness, no masses palpated. No hepatosplenomegaly. Bowel sounds positive.  Musculoskeletal: no clubbing / cyanosis. No joint deformity upper and lower extremities. Good ROM, no contractures. Normal muscle tone.  Skin: Maculopapular rash to face, chest, and back (no  oral involvement noted).  3+ LEE R>L up to knees. No notable dependent edema.   Neurologic: CN 2-12 grossly intact. Sensation intact. Strength 5/5 in all 4.  Psychiatric: Normal judgment and insight. Alert and oriented x 3. Normal mood.   Labs on Admission: I have personally reviewed following labs and imaging studies  CBC: Recent Labs  Lab 02/21/17 0905  WBC 12.4*  NEUTROABS 10.5*  HCT 36.1  MCV 82.4  PLT 94*   Basic Metabolic Panel: Recent Labs  Lab 02/21/17 0905  NA 130  K 4.2  CL 93*  CO2 24  GLUCOSE 101  BUN 44*  CREATININE 1.30*  CALCIUM 7.4*   GFR: Estimated Creatinine Clearance: 43.6 mL/min (A) (by C-G formula based on SCr of 1.3 mg/dL (H)). Liver Function Tests: Recent Labs  Lab 02/21/17 0905  AST 96*  ALT 51*  ALKPHOS 491*  BILITOT 1.3  PROT 6.3*  ALBUMIN 2.9*   No results for input(s): LIPASE, AMYLASE in the last 168 hours. No results for input(s): AMMONIA in the last 168 hours. Coagulation Profile: No results for input(s): INR, PROTIME in the last 168 hours. Cardiac Enzymes: Recent Labs  Lab 02/21/17 1220  TROPONINI 0.03*   BNP (last 3 results) No results for input(s): PROBNP in the last 8760 hours. HbA1C: No results for input(s): HGBA1C in the last 72 hours. CBG: No results for input(s): GLUCAP in the last 168 hours. Lipid Profile: No results for input(s): CHOL, HDL, LDLCALC, TRIG, CHOLHDL, LDLDIRECT in the last 72 hours. Thyroid Function Tests: Recent Labs    02/21/17 0905  TSH 3.667   Anemia Panel: No results for input(s): VITAMINB12, FOLATE, FERRITIN, TIBC, IRON,  RETICCTPCT in the last 72 hours. Urine analysis: No results found for: COLORURINE, APPEARANCEUR, LABSPEC, PHURINE, GLUCOSEU, HGBUR, BILIRUBINUR, KETONESUR, PROTEINUR, UROBILINOGEN, NITRITE, LEUKOCYTESUR  Radiological Exams on Admission: Dg Chest 2 View  Result Date: 02/21/2017 CLINICAL DATA:  Shortness of breath. EXAM: CHEST  2 VIEW COMPARISON:  02/14/2017 PET-CT 01/30/2017. FINDINGS: PowerPort catheter noted with tip over the superior vena cava. Heart size normal. Multiple pulmonary nodular densities are again noted consistent patient's known metastatic disease. Persistent mild bibasilar subsegmental atelectasis. Heart size normal. IMPRESSION: 1.  PowerPort catheter stable position. 2. Diffuse bilateral pulmonary nodular densities are again noted consistent with metastatic disease. Similar findings noted on prior exam. Mild bibasilar subsegmental atelectasis and/or scarring. Electronically Signed   By: Marcello Moores  Register   On: 02/21/2017 12:08    EKG: Independently reviewed. Compared to priors.  Appears similar.  Sinus tachycardia.  Normal axis.  Normal intervals.  Some T wave flattening in I and aVL, but otherwise no ST-T wave changes concerning for ischemia (? Minimal ST depression in inferior leads, but poor tracing).  Assessment/Plan Active Problems:   Failure to thrive (0-17)   DOE (dyspnea on exertion)   Failure to thrive  Decreased Appetite: Progressive generalized weakness over past few weeks, decreased PO intake.  Likely related to her metastatic merkel cell.  She started avelumab on 1/17 which could be contributing as well (notably both fatigue and peripheral edema noted as common side effects).  Her worsening peripheral edema is contributing (notes legs heavy).  PT Continue megace Nutrition Follow up and treat below  Dyspnea on Exertion  Elevated Troponin:  Pt with progressive DOE over the past few weeks.  Sounds like this has worsened along with her lower extremity swelling.   She's satting 99% on room air here.  She is mildly tachycardic.  She decline  CT scan of chest to r/o PE in the ED and continues to decline this here (for now, with normal sat on room air, will continue to monitor closely, follow up LE dopplers, and rediscuss if persistent and not improving).  She denies CP or ever having chest pain.  Troponin mildly elevated at 0.03.  Will continue to trend.  Will follow up echo.  BNP wnl.  CXR with pulmonary metastatic disease without consolidation.  Echo Trend troponin Repeat AM EKG  Lower Extremity Edema:  No VTE on 1/17 (R leg), but [place] notable for R inguinal adenopathy as well as decreased venous resp phasicity suggesting central obstructing lesion "possibly related to bulky R pelvic and retroperitoneal adenopathy noted on PET-CT".  Suspect edema is most likely related to metastatic disease and poor venous return.  Normal BNP and CXR without edema suggests against HF.  Albumin is 2.9.  Will check UA for proteinuria.  Will repeat LE [place] (and perform LLE [place]) TED hose Hold off on diuresis at this point given below and concern for volume depletion  Acute Kidney Injury  Concern for volume depletion:  Baseline creatinine looks to be ~0.7.  Worsened over past few weeks.  She reports decreased appetite over this period of time.  She had soft BP's in office with Dr. Marin Olp, BP's here are a bit better in the 782'N systolic.  Suspect she may be hypovolemic by history, but exam with impressive LEE (though I don't note dependent sacral edema or JVD and CXR without edema).  Will f/u renal US given mild R hydroureteronephrosis seen on 1/9 PET scan.   Renal US Orthostatics Gentle hydration x12 hours Follow UA as noted above  Elevated Liver Enzymes:  Elevated AST, alk phos, and ALT.  Possibly related to hypovolemia vs liver mets vs avelumab.  Alk phos likely related to boney mets.  Normal bili.   Continue to follow Check hepatitis panel  Rash: has a extensive rash over back  and chest and face, blanching, maculopapular.  No oral involvement.  Started on medrol dose pack by Dr. Marin Olp for concern for allergic reaction to avelumab.   Will start prednisone 60 mg daily  Chronic Pain  Nausea: continue long acting oxycodone BID (checked PMP aware and I can't see where she filled this - see recent oxycodone short acting, but she notes she's not taking this anymore and also that she's taking 12 hr med, also noted in previous note from Dr. Marin Olp - will continue this for now).  Ativan prn nausea as well as prn zofran.  Muscle relaxer  Metastatic Merkel Cell Carcinoma: mets to lung, bone, and LN's.  Was on avelumab and xgeva, but has been declining over past few weeks as noted above.  Rash though due to avelumab.  Appreciate Dr. Antonieta Pert assistance.  Further therapy per Dr. Marin Olp.    Stage 1 Pressure ulcer: seen by nursing, follow up tomorrow  DVT prophylaxis: heparin  Code Status: full, had extensive discussion regarding code status.  Mrs. Ground not sure at this point.  She'd like to continue to think about it.  Will continue to address.    Family Communication: none at bedside Disposition Plan: pending improvement  Consults called: Dr. Marin Olp, oncology, admit Admission status: telemetry   Fayrene Helper MD [facility] Pager [phone]  If 7PM-7AM, please contact night-coverage [email or web address] Password [facility]  02/21/2017, 6:49 PM

## 2017-02-21 NOTE — Progress Notes (Signed)
Dr. Florene Glen in to see patient. Order for fluid bolus received and will continue to monitor troponin level. Pt has no active chest pain.

## 2017-02-21 NOTE — Progress Notes (Signed)
Patient orthostatic vital signs completed. Alger Memos, NP informed of results.

## 2017-02-22 ENCOUNTER — Other Ambulatory Visit: Payer: Self-pay

## 2017-02-22 ENCOUNTER — Observation Stay (HOSPITAL_COMMUNITY): Payer: Medicare Other

## 2017-02-22 ENCOUNTER — Observation Stay (HOSPITAL_BASED_OUTPATIENT_CLINIC_OR_DEPARTMENT_OTHER): Payer: Medicare Other

## 2017-02-22 DIAGNOSIS — C787 Secondary malignant neoplasm of liver and intrahepatic bile duct: Secondary | ICD-10-CM

## 2017-02-22 DIAGNOSIS — M1990 Unspecified osteoarthritis, unspecified site: Secondary | ICD-10-CM

## 2017-02-22 DIAGNOSIS — C7951 Secondary malignant neoplasm of bone: Secondary | ICD-10-CM

## 2017-02-22 DIAGNOSIS — R609 Edema, unspecified: Secondary | ICD-10-CM

## 2017-02-22 DIAGNOSIS — R21 Rash and other nonspecific skin eruption: Secondary | ICD-10-CM

## 2017-02-22 DIAGNOSIS — E785 Hyperlipidemia, unspecified: Secondary | ICD-10-CM

## 2017-02-22 DIAGNOSIS — C7800 Secondary malignant neoplasm of unspecified lung: Secondary | ICD-10-CM

## 2017-02-22 DIAGNOSIS — K5900 Constipation, unspecified: Secondary | ICD-10-CM

## 2017-02-22 DIAGNOSIS — C779 Secondary and unspecified malignant neoplasm of lymph node, unspecified: Secondary | ICD-10-CM

## 2017-02-22 DIAGNOSIS — R600 Localized edema: Secondary | ICD-10-CM

## 2017-02-22 DIAGNOSIS — K219 Gastro-esophageal reflux disease without esophagitis: Secondary | ICD-10-CM

## 2017-02-22 DIAGNOSIS — E559 Vitamin D deficiency, unspecified: Secondary | ICD-10-CM

## 2017-02-22 DIAGNOSIS — C4A9 Merkel cell carcinoma, unspecified: Secondary | ICD-10-CM | POA: Diagnosis not present

## 2017-02-22 DIAGNOSIS — R627 Adult failure to thrive: Secondary | ICD-10-CM | POA: Diagnosis not present

## 2017-02-22 DIAGNOSIS — Z79899 Other long term (current) drug therapy: Secondary | ICD-10-CM | POA: Diagnosis not present

## 2017-02-22 LAB — BLOOD GAS, VENOUS
ACID-BASE EXCESS: 1 mmol/L (ref 0.0–2.0)
BICARBONATE: 24.4 mmol/L (ref 20.0–28.0)
O2 Saturation: 63.6 %
Patient temperature: 98.6
pCO2, Ven: 36.2 mmHg — ABNORMAL LOW (ref 44.0–60.0)
pH, Ven: 7.444 — ABNORMAL HIGH (ref 7.250–7.430)
pO2, Ven: 33.5 mmHg (ref 32.0–45.0)

## 2017-02-22 LAB — BASIC METABOLIC PANEL
Anion gap: 13 (ref 5–15)
Anion gap: 11 (ref 5–15)
BUN: 44 mg/dL — AB (ref 6–20)
BUN: 40 mg/dL — AB (ref 6–20)
CHLORIDE: 95 mmol/L — AB (ref 101–111)
CO2: 20 mmol/L — ABNORMAL LOW (ref 22–32)
CO2: 23 mmol/L (ref 22–32)
CREATININE: 0.86 mg/dL (ref 0.44–1.00)
Calcium: 6.7 mg/dL — ABNORMAL LOW (ref 8.9–10.3)
Calcium: 7.4 mg/dL — ABNORMAL LOW (ref 8.9–10.3)
Chloride: 95 mmol/L — ABNORMAL LOW (ref 101–111)
Creatinine, Ser: 0.76 mg/dL (ref 0.44–1.00)
GFR calc Af Amer: >60 mL/min (ref >60)
GFR calc non Af Amer: >60 mL/min (ref >60)
Glucose, Bld: 151 mg/dL — ABNORMAL HIGH (ref 65–99)
Glucose, Bld: 119 mg/dL — ABNORMAL HIGH (ref 65–99)
Potassium: 4 mmol/L (ref 3.5–5.1)
Potassium: 3.7 mmol/L (ref 3.5–5.1)
Sodium: 128 mmol/L — ABNORMAL LOW (ref 135–145)
Sodium: 129 mmol/L — ABNORMAL LOW (ref 135–145)

## 2017-02-22 LAB — COMPREHENSIVE METABOLIC PANEL
ALT: 44 U/L (ref 14–54)
AST: 104 U/L — ABNORMAL HIGH (ref 15–41)
Albumin: 2.4 g/dL — ABNORMAL LOW (ref 3.5–5.0)
Alkaline Phosphatase: 499 U/L — ABNORMAL HIGH (ref 38–126)
Anion gap: 12 (ref 5–15)
BUN: 43 mg/dL — ABNORMAL HIGH (ref 6–20)
CALCIUM: 6.7 mg/dL — AB (ref 8.9–10.3)
CHLORIDE: 93 mmol/L — AB (ref 101–111)
CO2: 21 mmol/L — ABNORMAL LOW (ref 22–32)
CREATININE: 0.93 mg/dL (ref 0.44–1.00)
Glucose, Bld: 108 mg/dL — ABNORMAL HIGH (ref 65–99)
Potassium: 4.3 mmol/L (ref 3.5–5.1)
Sodium: 126 mmol/L — ABNORMAL LOW (ref 135–145)
Total Bilirubin: 1.3 mg/dL — ABNORMAL HIGH (ref 0.3–1.2)
Total Protein: 5.5 g/dL — ABNORMAL LOW (ref 6.5–8.1)

## 2017-02-22 LAB — CBC
HCT: 32.5 % — ABNORMAL LOW (ref 36.0–46.0)
Hemoglobin: 11.1 g/dL — ABNORMAL LOW (ref 12.0–15.0)
MCH: 27.8 pg (ref 26.0–34.0)
MCHC: 34.2 g/dL (ref 30.0–36.0)
MCV: 81.5 fL (ref 78.0–100.0)
PLATELETS: 80 10*3/uL — AB (ref 150–400)
RBC: 3.99 MIL/uL (ref 3.87–5.11)
RDW: 17.3 % — ABNORMAL HIGH (ref 11.5–15.5)
WBC: 12.2 10*3/uL — AB (ref 4.0–10.5)

## 2017-02-22 LAB — OSMOLALITY, URINE: Osmolality, Ur: 608 mOsm/kg (ref 300–900)

## 2017-02-22 LAB — SODIUM, URINE, RANDOM

## 2017-02-22 LAB — TROPONIN I
TROPONIN I: 0.05 ng/mL — AB (ref <0.03)
TROPONIN I: 0.04 ng/mL — AB (ref <0.03)

## 2017-02-22 LAB — OSMOLALITY: Osmolality: 283 mOsm/kg (ref 275–295)

## 2017-02-22 MED ORDER — ENSURE ENLIVE PO LIQD
237.0000 mL | Freq: Two times a day (BID) | ORAL | Status: DC
  Administered 2017-02-22 – 2017-02-23 (×2): 237 mL via ORAL

## 2017-02-22 MED ORDER — ALUM & MAG HYDROXIDE-SIMETH 200-200-20 MG/5ML PO SUSP
30.0000 mL | ORAL | Status: DC | PRN
Start: 2017-02-22 — End: 2017-02-24
  Administered 2017-02-22: 30 mL via ORAL
  Filled 2017-02-22: qty 30

## 2017-02-22 MED ORDER — SODIUM CHLORIDE 0.9 % IV SOLN
1.0000 g | Freq: Once | INTRAVENOUS | Status: AC
  Administered 2017-02-22: 1 g via INTRAVENOUS
  Filled 2017-02-22: qty 10

## 2017-02-22 MED ORDER — ALBUMIN HUMAN 25 % IV SOLN
50.0000 g | Freq: Once | INTRAVENOUS | Status: AC
  Administered 2017-02-22: 50 g via INTRAVENOUS
  Filled 2017-02-22: qty 200

## 2017-02-22 MED ORDER — FUROSEMIDE 10 MG/ML IJ SOLN
60.0000 mg | Freq: Once | INTRAMUSCULAR | Status: AC
  Administered 2017-02-22: 60 mg via INTRAVENOUS
  Filled 2017-02-22: qty 6

## 2017-02-22 NOTE — Progress Notes (Signed)
Patient has bruise to left lower abdomen, some blood draining very slowly. Alger Memos, NP made aware. Allevyn foam dressing applied. Will continue to monitor.

## 2017-02-22 NOTE — Progress Notes (Signed)
*  PRELIMINARY RESULTS* Vascular Ultrasound Bilateral lower extremity venous duplex has been completed.  Preliminary findings: No evidence of deep vein thrombosis in the visualized veins of the lower extremities or baker's cysts bilaterally.  Limited exam secondary to pitting edema.  Grossly enlarged lymph nodes noted in the right groin area.  Everrett Coombe 02/22/2017, 9:52 AM

## 2017-02-22 NOTE — Care Management Note (Signed)
Case Management Note  Patient Details  Name: KELLA SPLINTER MRN: [id number] Date of Birth: 1945/12/06  Subjective/Objective: 72 y/o f admitted w/Failure to thrive. From home. w/spouse. Has rw,w/c,3n1. PT recc HHPT-patient chose [facility]-rep Karen aware.                   Action/Plan:d/c home w/HHC.   Expected Discharge Date:  (unknown)               Expected Discharge Plan:  [place]  In-House Referral:     Discharge planning Services  CM Consult  Post Acute Care Choice:  Durable Medical Equipment(rw,w/c,3n1) Choice offered to:  Patient  DME Arranged:    DME Agency:     HH Arranged:  PT [place]:  [place]  Status of Service:  In process, will continue to follow  If discussed at Long Length of Stay Meetings, dates discussed:    Additional Comments:  Dessa Phi, RN 02/22/2017, 12:57 PM

## 2017-02-22 NOTE — Progress Notes (Signed)
Pt ambulated 100 ft in the hall with walker and PT.  O2 sat 94% before ambulation and maintained that level throughout the walk.  Pt stated she was tired and weak, but no signs of dyspnea.

## 2017-02-22 NOTE — Progress Notes (Signed)
CRITICAL VALUE ALERT  Critical Value:  Troponin 0.05  Date & Time Notied:  02/22/2017 0554   Provider Notified: Alger Memos NP  Orders Received/Actions taken:

## 2017-02-22 NOTE — Evaluation (Signed)
Physical Therapy Evaluation Patient Details Name: [name] [name] MRN: [id number] DOB: Aug 23, 1945 Today's Date: 02/22/2017   History of Present Illness  72 yo female admitted with FTT, LE edema, DOE. hx of met Merkel cell carcinoma.   Clinical Impression  On eval, pt required Min assist for mobility. She walked ~50 feet using a RW. Pt presents with general weakness, decreased activity tolerance, and impaired gait and balance. RN checked O2 sats while ambulating: >90% on RA; dyspnea 2/4. Pt fatigues fairly easily. Discussed d/c plan-pt plans to return home with family assisting as needed. Recommend HHPT.     Follow Up Recommendations Home health PT;Supervision/Assistance - 24 hour    Equipment Recommendations  None recommended by PT    Recommendations for Other Services       Precautions / Restrictions Precautions Precautions: Fall Restrictions Weight Bearing Restrictions: No      Mobility  Bed Mobility Overal bed mobility: Needs Assistance Bed Mobility: Supine to Sit     Supine to sit: [facility] elevated;Min assist     General bed mobility comments: Assist for LEs off of bed. Increased time. Cues for technique.   Transfers Overall transfer level: Needs assistance Equipment used: Rolling walker (2 wheeled) Transfers: Sit to/from Stand Sit to Stand: From elevated surface;Min assist         General transfer comment: Assist to rise, stabilize, control descent. VCS safety, hand placement.   Ambulation/Gait Ambulation/Gait assistance: Min assist Ambulation Distance (Feet): 50 Feet Assistive device: Rolling walker (2 wheeled) Gait Pattern/deviations: Step-through pattern     General Gait Details: slow gait speed. Pt fatigues easily. Dyspnea 2/4. O2 sats >90% on RA.   Stairs            Wheelchair Mobility    Modified Rankin (Stroke Patients Only)       Balance Overall balance assessment: Needs assistance         Standing balance support: Bilateral upper  extremity supported Standing balance-Leahy Scale: Poor                               Pertinent Vitals/Pain Pain Assessment: Faces Faces Pain Scale: Hurts little more Pain Location: back, LEs Pain Descriptors / Indicators: Sore;Discomfort Pain Intervention(s): Limited activity within patient's tolerance;Repositioned    Home Living Family/patient expects to be discharged to:: Private residence Living Arrangements: Spouse/significant other   Type of Home: House Home Access: Stairs to enter   Technical brewer of Steps: 2 Home Layout: Two level;Able to live on main level with bedroom/bathroom Home Equipment: Gilford Rile - 2 wheels;Wheelchair - manual;Bedside commode Additional Comments: has 2 bsc. uses one as a shower chair    Prior Function Level of Independence: Needs assistance   Gait / Transfers Assistance Needed: uses walker. wheelchair for community     Comments: pt stated she has plenty of help from family      Hand Dominance        Extremity/Trunk Assessment   Upper Extremity Assessment Upper Extremity Assessment: Generalized weakness    Lower Extremity Assessment Lower Extremity Assessment: Generalized weakness(bil LE edema)    Cervical / Trunk Assessment Cervical / Trunk Assessment: (rash on back)  Communication   Communication: No difficulties  Cognition Arousal/Alertness: Awake/alert Behavior During Therapy: WFL for tasks assessed/performed Overall Cognitive Status: Within Functional Limits for tasks assessed  General Comments      Exercises     Assessment/Plan    PT Assessment Patient needs continued PT services  PT Problem List Decreased strength;Decreased balance;Decreased mobility;Pain       PT Treatment Interventions DME instruction;Functional mobility training;Gait training;Therapeutic activities;Balance training;Patient/family education    PT Goals (Current goals  can be found in the Care Plan section)  Acute Rehab PT Goals Patient Stated Goal: to feel better/get stronger PT Goal Formulation: With patient Time For Goal Achievement: 03/08/17 Potential to Achieve Goals: Good    Frequency Min 3X/week   Barriers to discharge        Co-evaluation               AM-PAC PT "6 Clicks" Daily Activity  Outcome Measure Difficulty turning over in bed (including adjusting bedclothes, sheets and blankets)?: Unable Difficulty moving from lying on back to sitting on the side of the bed? : Unable Difficulty sitting down on and standing up from a chair with arms (e.g., wheelchair, bedside commode, etc,.)?: Unable Help needed moving to and from a bed to chair (including a wheelchair)?: A Little Help needed walking in hospital room?: A Little Help needed climbing 3-5 steps with a railing? : A Lot 6 Click Score: 11    End of Session Equipment Utilized During Treatment: Gait belt Activity Tolerance: Patient limited by fatigue Patient left: in chair;with call bell/phone within reach   PT Visit Diagnosis: Muscle weakness (generalized) (M62.81);Difficulty in walking, not elsewhere classified (R26.2)    Time: 1100-1117 PT Time Calculation (min) (ACUTE ONLY): 17 min   Charges:   PT Evaluation $PT Eval Moderate Complexity: 1 Mod     PT G Codes:          Weston Anna, MPT Pager: [phone]

## 2017-02-22 NOTE — Care Management Note (Signed)
Case Management Note  Patient Details  Name: [name] [name] MRN: [id number] Date of Birth: 24-Mar-1945  Subjective/Objective: 72 y/o f admitted w/Failure to thrive. Hx: Merkel Cell Ca,mets lung. Poor appetite. From home. PT-recc HHPT.                    Action/Plan:d/c home w/HHC.   Expected Discharge Date:  (unknown)               Expected Discharge Plan:  [place]  In-House Referral:     Discharge planning Services  CM Consult  Post Acute Care Choice:    Choice offered to:     DME Arranged:    DME Agency:     HH Arranged:    HH Agency:     Status of Service:  In process, will continue to follow  If discussed at Long Length of Stay Meetings, dates discussed:    Additional Comments:  Dessa Phi, RN 02/22/2017, 11:49 AM

## 2017-02-22 NOTE — Progress Notes (Signed)
PROGRESS NOTE    [name] [name]  TIW:[id number] DOB: 09/27/1945 DOA: 02/21/2017 PCP: Abner Greenspan, MD   Brief Narrative:  [name] [name] is [name] [name] 72 y.o. 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  Assessment & Plan:   Principal Problem:   Failure to thrive (0-17) Active Problems:   DOE (dyspnea on exertion)   Pressure injury of skin   Failure to thrive  Decreased Appetite: Progressive generalized weakness over past few weeks, decreased PO intake.  Likely related to her metastatic merkel cell.  She started avelumab on 1/17 which could be contributing as well (notably both fatigue and peripheral edema noted as common side effects).  She's had worsening peripheral edema as well and this is likely contributing as she has hard time walking because of this.  PT Continue megace Nutrition c/s Follow up and treat below  Dyspnea on Exertion  Elevated Troponin:  Pt with progressive DOE over the past few weeks.  Sounds like this has worsened along with her lower extremity swelling.  She's satting 99% on room air here.  She is mildly tachycardic.  She declined CT scan of chest to r/o PE in the ED and continues to decline this here today (for now, with normal sat on room air, will continue to monitor closely, follow up LE dopplers, and rediscuss if persistent and not improving).  She denies CP or ever having chest pain.  Troponin mildly elevated, but generally flat and no chest pain.   Will follow up echo.   CXR with diffuse bilateral pulmonary nodular densities c/w metastatic disease Trend troponin (mild, flat as noted above) Repeat AM EKG (sinus rhythm, short PR, no ST-T wave changes concerning for ischemia) 6 min walk ordered  Hyponatremia:  Possibly SIADH.  She seemed volume depleted with positive orthostatics on presenation,  but does have impressive LEE.  Will hold IVF for now.  Follow up urine sodium, urine osm, serum osm.  May need fluid restriction.  Of note, Dr. Marin Olp did order lasix and albumin.  Will see how she does with this.  Lower Extremity Edema:  No VTE on 1/17 (R leg), but [place] notable for R inguinal adenopathy as well as decreased venous resp phasicity suggesting central obstructing lesion "possibly related to bulky R pelvic and retroperitoneal adenopathy noted on PET-CT".  Suspect edema is most likely related to metastatic disease and poor venous return.  Normal BNP and CXR without edema suggests against HF.  Albumin is 2.9.  Will check UA for proteinuria.  Will repeat LE [place] (and perform LLE [place]) - prelim without evidence of DVT TED hose (unable to put ted hose on, will use ACE wrap) As above, will follow response to lasix and albumin per Dr. Marin Olp  Acute Kidney Injury  Concern for volume depletion:  Baseline creatinine looks to be ~0.7.  Peaked to 1.3 yesterday, improved today after IVF.   Had positive orthostatics, intravascularly depleted, but has impressive peripheral edema.   Renal US (with persistent R mod hydronephrosis, unchanged compared to prior PET CT) Orthostatics - positive on admission, improved this morning S/p bolus and IVF, now will watch response to albumin/lasix as above  Metabolic Acidosis: repeat BMP and VBG and lactate this afternoon.  AG when correcting for albumin.    Elevated Liver Enzymes:  Elevated AST, alk phos, and ALT.  Possibly related to  hypovolemia vs liver mets vs avelumab.  Alk phos likely related to boney mets.  Normal bili.   Continue to follow Check hepatitis panel  Rash: has Merial Moritz extensive rash over back and chest and face, blanching, maculopapular.  No oral involvement.  Started on medrol dose pack by Dr. Marin Olp for concern for allergic reaction to avelumab.   Continue prednisone 60 mg daily  Chronic Pain  Nausea: continue long acting oxycodone BID  (checked PMP aware and I can't see where she filled this - see recent oxycodone short acting, but she notes she's not taking this anymore and also that she's taking 12 hr med, also noted in previous note from Dr. Marin Olp - will continue this for now).  Ativan prn nausea as well as prn zofran.  Muscle relaxer  Metastatic Merkel Cell Carcinoma: mets to lung, bone, and LN's.  Was on avelumab and xgeva, but has been declining over past few weeks as noted above.  Rash though due to avelumab.  Appreciate Dr. Antonieta Pert assistance.  Further therapy per Dr. Marin Olp.    Stage 1 Pressure ulcer: pt with foam dressing  DVT prophylaxis: heparin Code Status: full Family Communication: none at bedside Disposition Plan: pending   Consultants:   Dr. Marin Olp  Procedures:   Echo and LE [place] pending  Antimicrobials: Anti-infectives (From admission, onward)   None     Subjective: Feels better than yesterday.   Objective: Vitals:   02/21/17 1551 02/21/17 1652 02/21/17 2032 02/22/17 0542  BP: 103/65 108/60    Pulse: 98 (!) 102    Resp: _0 Temp: 98 F (36.7 C) 97.8 F (36.6 C) (!) 97.5 F (36.4 C) 97.9 F (36.6 C)  TempSrc:  Oral Oral Oral  SpO2: 98% 99%  99%  Weight:  86 kg (189 lb 9.5 oz)  87.8 kg (193 lb 9.6 oz)  Height:  5' 5" (1.651 m)      Intake/Output Summary (Last 24 hours) at 02/22/2017 0828 Last data filed at 02/22/2017 0600 Gross per 24 hour  Intake 1076.67 ml  Output 200 ml  Net 876.67 ml   Filed Weights   02/21/17 1652 02/22/17 0542  Weight: 86 kg (189 lb 9.5 oz) 87.8 kg (193 lb 9.6 oz)    Examination:  General exam: Appears calm and comfortable  Respiratory system: Clear to auscultation. Respiratory effort normal. Cardiovascular system: S1 & S2 heard, RRR. No JVD, murmurs, rubs, gallops or clicks. No pedal edema. Gastrointestinal system: Abdomen is nondistended, soft and nontender. No organomegaly or masses felt. Normal bowel sounds heard. Central nervous  system: Alert and oriented. No focal neurological deficits. Extremities: R>L LEE 3+ Skin: diffuse rash to chest, back, and face, maculopapular Psychiatry: Judgement and insight appear normal. Mood & affect appropriate.     Data Reviewed: I have personally reviewed following labs and imaging studies  CBC: Recent Labs  Lab 02/21/17 0905 02/21/17 1950 02/22/17 0409  WBC 12.4* 12.5* 12.2*  NEUTROABS 10.5*  --   --   HGB  --  11.9* 11.1*  HCT 36.1 35.3* 32.5*  MCV 82.4 81.1 81.5  PLT 94* 89* 80*   Basic Metabolic Panel: Recent Labs  Lab 02/21/17 0905 02/21/17 1950 02/22/17 0409  NA 130  --  126*  K 4.2  --  4.3  CL 93*  --  93*  CO2 24  --  21*  GLUCOSE 101  --  108*  BUN 44*  --  43*  CREATININE 1.30* 1.05* 0.93  CALCIUM 7.4*  --  6.7*   GFR: Estimated Creatinine Clearance: 61.6 mL/min (by C-G formula based on SCr of 0.93 mg/dL). Liver Function Tests: Recent Labs  Lab 02/21/17 0905 02/22/17 0409  AST 96* 104*  ALT 51* 44  ALKPHOS 491* 499*  BILITOT 1.3 1.3*  PROT 6.3* 5.5*  ALBUMIN 2.9* 2.4*   No results for input(s): LIPASE, AMYLASE in the last 168 hours. No results for input(s): AMMONIA in the last 168 hours. Coagulation Profile: No results for input(s): INR, PROTIME in the last 168 hours. Cardiac Enzymes: Recent Labs  Lab 02/21/17 1220 02/21/17 1950 02/22/17 0409  TROPONINI 0.03* 0.04* 0.05*   BNP (last 3 results) No results for input(s): PROBNP in the last 8760 hours. HbA1C: No results for input(s): HGBA1C in the last 72 hours. CBG: No results for input(s): GLUCAP in the last 168 hours. Lipid Profile: No results for input(s): CHOL, HDL, LDLCALC, TRIG, CHOLHDL, LDLDIRECT in the last 72 hours. Thyroid Function Tests: Recent Labs    02/21/17 0905  TSH 3.667   Anemia Panel: No results for input(s): VITAMINB12, FOLATE, FERRITIN, TIBC, IRON, RETICCTPCT in the last 72 hours. Sepsis Labs: No results for input(s): PROCALCITON, LATICACIDVEN in the  last 168 hours.  No results found for this or any previous visit (from the past 240 hour(s)).       Radiology Studies: Dg Chest 2 View  Result Date: 02/21/2017 CLINICAL DATA:  Shortness of breath. EXAM: CHEST  2 VIEW COMPARISON:  02/14/2017 PET-CT 01/30/2017. FINDINGS: PowerPort catheter noted with tip over the superior vena cava. Heart size normal. Multiple pulmonary nodular densities are again noted consistent patient's known metastatic disease. Persistent mild bibasilar subsegmental atelectasis. Heart size normal. IMPRESSION: 1.  PowerPort catheter stable position. 2. Diffuse bilateral pulmonary nodular densities are again noted consistent with metastatic disease. Similar findings noted on prior exam. Mild bibasilar subsegmental atelectasis and/or scarring. Electronically Signed   By: Thomas  Register   On: 02/21/2017 12:08        Scheduled Meds: . furosemide  60 mg Intravenous Once  . heparin  5,000 Units Subcutaneous Q8H  . megestrol  400 mg Oral BID  . oxyCODONE  15 mg Oral Q12H  . predniSONE  60 mg Oral Q breakfast   Continuous Infusions: . albumin human       LOS: 0 days    Time spent: over 30 min    Caldwell Powell, MD [facility] Pager [phone]  If 7PM-7AM, please contact night-coverage [email or web address] Password [email or web address] 02/22/2017, 8:28 AM   

## 2017-02-22 NOTE — Consult Note (Signed)
Referral MD  Reason for Referral: Metastatic Merkel cell carcinoma  Chief Complaint  Patient presents with  . Shortness of Breath  . Leg Swelling  : I just have felt weak.  HPI: Ms. Corwin is well-known to me.  She is a very nice 72 year old white female.  She has metastatic Merkel cell carcinoma.  She has extensive lung, lymph node and bony metastasis.  She has had 1 cycle of immunotherapy with a Avelumab.  She had this on 02/07/2017.  She has had leg swelling that has been quite severe.  She developed a bad rash about 2 or 3 days before admission.  We saw her in the office yesterday.  She looked dehydrated.  She was quite febrile.  She was subsequently admitted.  Her main complaint has been marked swelling in her legs.  I think a lot of this is from extensive adenopathy within her pelvis.   The rash appears to be from the [place].  Her lab results when she came in showed a white cell count 12.4.  Hemoglobin 12.3.  Platelet count 94,000.  Her LDH was markedly elevated at 1952.  This is actually stable.  Her alkaline phosphatase was 491.  Her calcium was 7.4.  She has had hypercalcemia in the past.  Her liver function tests were slightly elevated.  Thankfully, her creatinine was normal at 1.3.  She was given IV fluids.  Her creatinine has improved to 0.86.  Her sodium is 128.  Her blood sugar is 151.  A chest x-ray that was done on admission showed the bilateral pulmonary nodules.  There was no pleural effusion.  She had no cardiomegaly.  She has some slight atelectasis.  She had Dopplers of her legs.  This is pending.  When she was in the office, I talked to she and her husband about her situation with her metastatic cancer.  She is not too interested in chemotherapy.  I think this would be our only other option for her if she got better.  Overall, I say her performance status is ECOG 3.     Past Medical History:  Diagnosis Date  . Arthritis   . Depression   . GERD  (gastroesophageal reflux disease)   . Hyperlipidemia   . 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 Osteoporosis   . Vitamin D deficiency   :  Past Surgical History:  Procedure Laterality Date  . ABDOMINAL HYSTERECTOMY    . IR FLUORO GUIDE PORT INSERTION RIGHT  02/05/2017  . IR US GUIDE VASC ACCESS RIGHT  02/05/2017  . SHOULDER SURGERY     right  :   Current Facility-Administered Medications:  .  acetaminophen (TYLENOL) tablet 650 mg, 650 mg, Oral, Q6H PRN **OR** acetaminophen (TYLENOL) suppository 650 mg, 650 mg, Rectal, Q6H PRN, Elodia Florence., MD .  feeding supplement (ENSURE ENLIVE) (ENSURE ENLIVE) liquid 237 mL, 237 mL, Oral, BID BM, Elodia Florence., MD, 237 mL at 02/22/17 1413 .  furosemide (LASIX) injection 60 mg, 60 mg, Intravenous, Once, Volanda Napoleon, MD .  heparin injection 5,000 Units, 5,000 Units, Subcutaneous, Q8H, Elodia Florence., MD, 5,000 Units at 02/22/17 1413 .  lactulose (CHRONULAC) 10 GM/15ML solution 10 g, 10 g, Oral, Q4H PRN, Elodia Florence., MD .  LORazepam (ATIVAN) tablet 0.5 mg, 0.5 mg, Oral, Q6H PRN, Elodia Florence., MD .  megestrol (MEGACE) 400 MG/10ML suspension 400 mg, 400 mg, Oral, BID, Elodia Florence., MD,  400 mg at 02/22/17 0921 .  ondansetron (ZOFRAN) tablet 4 mg, 4 mg, Oral, Q6H PRN **OR** ondansetron (ZOFRAN) injection 4 mg, 4 mg, Intravenous, Q6H PRN, Elodia Florence., MD .  oxyCODONE (OXYCONTIN) 12 hr tablet 15 mg, 15 mg, Oral, Q12H, Elodia Florence., MD, 15 mg at 02/22/17 9242 .  predniSONE (DELTASONE) tablet 60 mg, 60 mg, Oral, Q breakfast, Elodia Florence., MD, 60 mg at 02/21/17 2150 .  sodium chloride flush (NS) 0.9 % injection 10-40 mL, 10-40 mL, Intracatheter, PRN, Elodia Florence., MD, 20 mL at 02/22/17 0412 .  tiZANidine (ZANAFLEX) tablet 4 mg, 4 mg, Oral, Q6H PRN, Elodia Florence., MD .  zolpidem [facility]) tablet 5 mg, 5 mg, Oral, QHS PRN, Elodia Florence., MD, 5 mg at 02/21/17 2326:  . feeding supplement (ENSURE ENLIVE)  237 mL Oral BID BM  . furosemide  60 mg Intravenous Once  . heparin  5,000 Units Subcutaneous Q8H  . megestrol  400 mg Oral BID  . oxyCODONE  15 mg Oral Q12H  . predniSONE  60 mg Oral Q breakfast  :  Allergies  Allergen Reactions  . Evista [Raloxifene Hcl]     cramps  . Fosamax [Alendronate Sodium]     GI intolerance   . Gabapentin     REACTION: nausea  . Nsaids     REACTION: GI upset  :  Family History  Problem Relation Age of Onset  . Diabetes Father   . Cancer Father        lung cancer  . Cancer Sister        breast cancer  . Cancer Brother        lung cancer  . Coronary artery disease Paternal Uncle   . Cancer Maternal Grandfather        possible prostate  . Coronary artery disease Paternal Grandfather   . Heart disease Paternal Grandfather        CAD  :  Social History   Socioeconomic History  . Marital status: Married    Spouse name: Not on file  . Number of children: Not on file  . Years of education: Not on file  . Highest education level: Not on file  Social Needs  . Financial resource strain: Not on file  . Food insecurity - worry: Not on file  . Food insecurity - inability: Not on file  . Transportation needs - medical: Not on file  . Transportation needs - non-medical: Not on file  Occupational History  . Not on file  Tobacco Use  . Smoking status: Former Smoker    Last attempt to quit: 01/22/2005    Years since quitting: 12.0  . Smokeless tobacco: Never Used  Substance and Sexual Activity  . Alcohol use: No    Alcohol/week: 0.0 oz  . Drug use: No  . Sexual activity: Not on file  Other Topics Concern  . Not on file  Social History Narrative  . Not on file  :  Pertinent items are noted in HPI.  Exam: See prior notes. Patient Vitals for the past 24 hrs:  BP Temp Temp src Pulse Resp SpO2 Height Weight  02/22/17 1444 (!) 112/55 98.2 F (36.8 C) Oral 94 14 98 % - -   02/22/17 1230 124/70 - - 90 - - - -  02/22/17 1119 130/71 - - 92 - 94 % - -  02/22/17 0542 - 97.9 F (36.6 C) Oral -  16 99 % - 193 lb 9.6 oz (87.8 kg)  02/21/17 2032 - (!) 97.5 F (36.4 C) Oral - 16 - - -  02/21/17 1652 108/60 97.8 F (36.6 C) Oral (!) 102 18 99 % 5\' 5"  (1.651 m) 189 lb 9.5 oz (86 kg)     Recent Labs    02/21/17 1950 02/22/17 0409  WBC 12.5* 12.2*  HGB 11.9* 11.1*  HCT 35.3* 32.5*  PLT 89* 80*   Recent Labs    02/22/17 0409 02/22/17 0803  NA 126* 128*  K 4.3 4.0  CL 93* 95*  CO2 21* 20*  GLUCOSE 108* 151*  BUN 43* 44*  CREATININE 0.93 0.86  CALCIUM 6.7* 6.7*    Blood smear review: None  Pathology: None    Assessment and Plan: Ms. Kuck is a 72 year old white female.  She has metastatic Merkel cell carcinoma.  She has had 1 cycle of immunotherapy.  We are not a situation in which she has a disease that is not curable by any means.  When we treated her, our goal was to try to decrease her tumor burden so that she would have a better quality of life and be able to function at a higher level.  Clearly, this is not happen.  I am not sure that we will be able to get Ms. Heringer to the point that she will be able to improve.  I just am not sure she would be a candidate for systemic chemotherapy.  I just want to try to see her legs get better so that she can function and walk and be more independent.  She has quite a bit of abdominal and pelvic disease.  This is probably the problem.  Hopefully, she will get better with fluids.  We will follow along.  I know that she and her husband are certainly very aware of her disease being aggressive.  Lattie Haw, MD

## 2017-02-22 NOTE — Progress Notes (Signed)
Attempted to place TED hose on patient. Too tight and uncomfortable for pt to wear at this time, even using size XL. Legs elevated on pillows.

## 2017-02-22 NOTE — Progress Notes (Signed)
Initial Nutrition Assessment  DOCUMENTATION CODES:   Obesity unspecified  INTERVENTION:  RD to order Ensure Enlive (to be mixed with ice cream) BID; each supplement provides 350kcals and 20g of protein.  NUTRITION DIAGNOSIS:   Increased nutrient needs related to cancer and cancer related treatments as evidenced by estimated needs.  GOAL:   Patient will meet greater than or equal to 90% of their needs   MONITOR:   PO intake, Supplement acceptance, Weight trends, Labs, I & O's  REASON FOR ASSESSMENT:   Consult Assessment of nutrition requirement/status  ASSESSMENT:  72 y.o. 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    Pt reports having a decent appetite this morning and denies nausea currently and PTA. Pt reports she has been taking megastrol for the past 2-3 weeks which she thinks is working. Pt admits that pain decreases her appetite.   Pt did not have dinner last night although it was offered. This morning pt ate 50% of breakfast (pears, cereals, eggs, toast).  Pt reports eating mostly soft foods (jello, applesauce, mashed potatoes with gravy, egg salad, chocolate milk) for the past two weeks. Pt also reports not being able to wear her dentures since right before Christmas 2018. She states she cannot wear them because her mouth is dry and putting them in makes her gag. Pt reports food intake and appetite decline since Christmas 2018 but was eating good before that. Pt also states taste changes (bland taste) and dry mouth.  Pt reports having issues with constipation from medications as well it being a general problem she has had for years.   Pt just recently started to drink Ensure at home. She reports consuming 1/2 to 1 bottle a day for the past 5 days. Pt amenable to Ensure Enlive mixed with ice cream to increase  protein and calorie intake.   Pt reports her UBW is 190-199 which she remembers from December 2018.  Last scaled weight PTA was 190lb 4oz on 01/14/2017. Pt's weight increase during this admission is likely due to lower extremity fluid accumulation. Given fluctuations in fluid, pt's weight appears to be relatively stable from a nutrition standpoint.   Medications: Lasix, Megace, prednisone, one time IV calcium, one time IV albumin IV fluids  Recent medications: Avelumab, xgeva  Labs: Na (L)  NUTRITION - FOCUSED PHYSICAL EXAM:    Most Recent Value  Orbital Region  No depletion  Upper Arm Region  No depletion  Thoracic and Lumbar Region  No depletion  Buccal Region  No depletion  Temple Region  Mild depletion  Clavicle Bone Region  Mild depletion  Clavicle and Acromion Bone Region  Moderate depletion  Scapular Bone Region  Moderate depletion  Dorsal Hand  Moderate depletion  Patellar Region  Unable to assess  Anterior Thigh Region  Unable to assess  Posterior Calf Region  Unable to assess  Edema (RD Assessment)  Unable to assess     Lower body not assessed due to visible severe lower extremity edema possibly from medication (avelumab) or metastatic disease; Skin rash on the chest may also be from medication (avelumab).  Diet Order:  Diet regular Room service appropriate? Yes; Fluid consistency: Thin  EDUCATION NEEDS:   No education needs have been identified at this time  Skin:  Skin Assessment: Skin Integrity Issues: Skin Integrity Issues:: Stage I Stage I: sacrum  Last  BM:  PTA  Height:   Ht Readings from Last 1 Encounters:  02/21/17 5\' 5"  (1.651 m)    Weight:  Filed Weights   02/21/17 1652 02/22/17 0542  Weight: 189 lb 9.5 oz (86 kg) 193 lb 9.6 oz (87.8 kg)     Ideal Body Weight:  56.8 kg  BMI:  Body mass index is 32.22 kg/m.  Estimated Nutritional Needs:   Kcal:  9021-1155  Protein:  100-110  Fluid:  1.8L    Woodie Degraffenreid, MS, Dietetic  Intern Pager # [phone]

## 2017-02-23 ENCOUNTER — Observation Stay (HOSPITAL_BASED_OUTPATIENT_CLINIC_OR_DEPARTMENT_OTHER): Payer: Medicare Other

## 2017-02-23 ENCOUNTER — Observation Stay (HOSPITAL_COMMUNITY): Payer: Medicare Other

## 2017-02-23 DIAGNOSIS — E861 Hypovolemia: Secondary | ICD-10-CM | POA: Diagnosis present

## 2017-02-23 DIAGNOSIS — C7802 Secondary malignant neoplasm of left lung: Secondary | ICD-10-CM | POA: Diagnosis present

## 2017-02-23 DIAGNOSIS — E871 Hypo-osmolality and hyponatremia: Secondary | ICD-10-CM | POA: Diagnosis present

## 2017-02-23 DIAGNOSIS — I351 Nonrheumatic aortic (valve) insufficiency: Secondary | ICD-10-CM

## 2017-02-23 DIAGNOSIS — Z79818 Long term (current) use of other agents affecting estrogen receptors and estrogen levels: Secondary | ICD-10-CM | POA: Diagnosis not present

## 2017-02-23 DIAGNOSIS — R627 Adult failure to thrive: Secondary | ICD-10-CM | POA: Diagnosis present

## 2017-02-23 DIAGNOSIS — R0609 Other forms of dyspnea: Secondary | ICD-10-CM | POA: Diagnosis present

## 2017-02-23 DIAGNOSIS — K5903 Drug induced constipation: Secondary | ICD-10-CM | POA: Diagnosis present

## 2017-02-23 DIAGNOSIS — G8929 Other chronic pain: Secondary | ICD-10-CM | POA: Diagnosis present

## 2017-02-23 DIAGNOSIS — I951 Orthostatic hypotension: Secondary | ICD-10-CM | POA: Diagnosis present

## 2017-02-23 DIAGNOSIS — C7951 Secondary malignant neoplasm of bone: Secondary | ICD-10-CM | POA: Diagnosis present

## 2017-02-23 DIAGNOSIS — C4A71 Merkel cell carcinoma of right lower limb, including hip: Secondary | ICD-10-CM | POA: Diagnosis present

## 2017-02-23 DIAGNOSIS — Z90710 Acquired absence of both cervix and uterus: Secondary | ICD-10-CM | POA: Diagnosis not present

## 2017-02-23 DIAGNOSIS — E874 Mixed disorder of acid-base balance: Secondary | ICD-10-CM | POA: Diagnosis present

## 2017-02-23 DIAGNOSIS — N179 Acute kidney failure, unspecified: Secondary | ICD-10-CM | POA: Diagnosis present

## 2017-02-23 DIAGNOSIS — R0602 Shortness of breath: Secondary | ICD-10-CM | POA: Diagnosis present

## 2017-02-23 DIAGNOSIS — Z9109 Other allergy status, other than to drugs and biological substances: Secondary | ICD-10-CM | POA: Diagnosis not present

## 2017-02-23 DIAGNOSIS — E860 Dehydration: Secondary | ICD-10-CM | POA: Diagnosis present

## 2017-02-23 DIAGNOSIS — C7801 Secondary malignant neoplasm of right lung: Secondary | ICD-10-CM | POA: Diagnosis present

## 2017-02-23 DIAGNOSIS — T50995A Adverse effect of other drugs, medicaments and biological substances, initial encounter: Secondary | ICD-10-CM | POA: Diagnosis present

## 2017-02-23 DIAGNOSIS — R21 Rash and other nonspecific skin eruption: Secondary | ICD-10-CM | POA: Diagnosis present

## 2017-02-23 DIAGNOSIS — Z87891 Personal history of nicotine dependence: Secondary | ICD-10-CM | POA: Diagnosis not present

## 2017-02-23 DIAGNOSIS — L89151 Pressure ulcer of sacral region, stage 1: Secondary | ICD-10-CM | POA: Diagnosis present

## 2017-02-23 DIAGNOSIS — Z79899 Other long term (current) drug therapy: Secondary | ICD-10-CM | POA: Diagnosis not present

## 2017-02-23 DIAGNOSIS — R600 Localized edema: Secondary | ICD-10-CM | POA: Diagnosis present

## 2017-02-23 LAB — CBC
HCT: 29.7 % — ABNORMAL LOW (ref 36.0–46.0)
Hemoglobin: 10.1 g/dL — ABNORMAL LOW (ref 12.0–15.0)
MCH: 27.8 pg (ref 26.0–34.0)
MCHC: 34 g/dL (ref 30.0–36.0)
MCV: 81.8 fL (ref 78.0–100.0)
PLATELETS: 72 10*3/uL — AB (ref 150–400)
RBC: 3.63 MIL/uL — AB (ref 3.87–5.11)
RDW: 17.4 % — AB (ref 11.5–15.5)
WBC: 10.6 10*3/uL — ABNORMAL HIGH (ref 4.0–10.5)

## 2017-02-23 LAB — BASIC METABOLIC PANEL
ANION GAP: 11 (ref 5–15)
BUN: 41 mg/dL — ABNORMAL HIGH (ref 6–20)
CALCIUM: 7.7 mg/dL — AB (ref 8.9–10.3)
CO2: 24 mmol/L (ref 22–32)
Chloride: 95 mmol/L — ABNORMAL LOW (ref 101–111)
Creatinine, Ser: 0.84 mg/dL (ref 0.44–1.00)
Glucose, Bld: 113 mg/dL — ABNORMAL HIGH (ref 65–99)
Potassium: 3.8 mmol/L (ref 3.5–5.1)
Sodium: 130 mmol/L — ABNORMAL LOW (ref 135–145)

## 2017-02-23 LAB — HEPATITIS PANEL, ACUTE
HEP B C IGM: NEGATIVE
Hep A IgM: NEGATIVE
Hepatitis B Surface Ag: NEGATIVE

## 2017-02-23 LAB — HEPATIC FUNCTION PANEL
ALT: 44 U/L (ref 14–54)
AST: 93 U/L — ABNORMAL HIGH (ref 15–41)
Albumin: 3.3 g/dL — ABNORMAL LOW (ref 3.5–5.0)
Alkaline Phosphatase: 491 U/L — ABNORMAL HIGH (ref 38–126)
Bilirubin, Direct: 0.8 mg/dL — ABNORMAL HIGH (ref 0.1–0.5)
Indirect Bilirubin: 1 mg/dL — ABNORMAL HIGH (ref 0.3–0.9)
TOTAL PROTEIN: 6 g/dL — AB (ref 6.5–8.1)
Total Bilirubin: 1.8 mg/dL — ABNORMAL HIGH (ref 0.3–1.2)

## 2017-02-23 LAB — ECHOCARDIOGRAM COMPLETE
Height: 65 in
Weight: 3083.2 oz

## 2017-02-23 LAB — MAGNESIUM: MAGNESIUM: 2.3 mg/dL (ref 1.7–2.4)

## 2017-02-23 MED ORDER — OXYCODONE HCL 5 MG PO TABS
5.0000 mg | ORAL_TABLET | Freq: Four times a day (QID) | ORAL | Status: DC | PRN
  Administered 2017-02-23: 5 mg via ORAL
  Filled 2017-02-23: qty 1

## 2017-02-23 MED ORDER — HYDROCORTISONE 1 % EX CREA
TOPICAL_CREAM | Freq: Two times a day (BID) | CUTANEOUS | Status: DC | PRN
  Filled 2017-02-23: qty 28

## 2017-02-23 MED ORDER — HYDROCORTISONE 1 % EX LOTN
TOPICAL_LOTION | Freq: Two times a day (BID) | CUTANEOUS | Status: DC | PRN
  Filled 2017-02-23: qty 118

## 2017-02-23 MED ORDER — SODIUM CHLORIDE 0.9 % IV SOLN
INTRAVENOUS | Status: AC
  Administered 2017-02-23 – 2017-02-24 (×2): via INTRAVENOUS

## 2017-02-23 MED ORDER — METHYLPREDNISOLONE SODIUM SUCC 125 MG IJ SOLR
60.0000 mg | Freq: Two times a day (BID) | INTRAMUSCULAR | Status: DC
  Administered 2017-02-23 (×2): 60 mg via INTRAVENOUS
  Filled 2017-02-23 (×2): qty 2

## 2017-02-23 NOTE — Progress Notes (Signed)
Pt platelets 72, abdomen still slightly oozing blood. Hold 6am dose of heparin subcutaneous per Alger Memos, NP.

## 2017-02-23 NOTE — Progress Notes (Signed)
  Echocardiogram 2D Echocardiogram has been performed.  Merrie Roof F 02/23/2017, 9:40 AM

## 2017-02-23 NOTE — Progress Notes (Signed)
PROGRESS NOTE    [name] [name]  TAV:[id number] DOB: 04/22/1945 DOA: 02/21/2017 PCP: Abner Greenspan, MD   Brief Narrative:  [name] [name] is a 72 y.o. 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  Assessment & Plan:   Principal Problem:   Failure to thrive (0-17) Active Problems:   DOE (dyspnea on exertion)   Pressure injury of skin   Failure to thrive  Decreased Appetite: Progressive generalized weakness over past few weeks, decreased PO intake.  Likely related to her metastatic merkel cell.  She started avelumab on 1/17 which could be contributing as well (notably both fatigue and peripheral edema noted as common side effects).  She's had worsening peripheral edema as well and this is likely contributing as she has hard time walking because of this.  PT (home health PT, 24 hour supervision) Continue megace Nutrition c/s Follow up and treat below  Dyspnea on Exertion  Elevated Troponin:  Pt with progressive DOE over the past few weeks.  Sounds like this has worsened along with her lower extremity swelling.  She's satting 99% on room air here.  She is mildly tachycardic at presentation.  She declined CT scan of chest to r/o PE in the ED and continues to decline this since presentation.  She denies CP or ever having chest pain.  Troponin mildly elevated, but generally flat and no chest pain.  Suspect related to metastatic dz with pulm involvement, she has impressive LEE, but with positive orthostatics, suspect she's intravascularly depleted.   Echo with normal EF, diastolic dysfunction  CXR with diffuse bilateral pulmonary nodular densities c/w metastatic disease Repeat CXR on 2/2 with no interval change (known metastatic dz, no acute infiltrate) Trend troponin (mild, flat as noted above) Repeat EKG (sinus rhythm, short  PR, no ST-T wave changes concerning for ischemia) Pt ambulated with nursing, 94% and maintained  Hyponatremia:  Improved (not sure if 126 was spurious as it improved throughout day without much intervention).  Low urine sodium, positive orthostatics again 2/2.  Suggests intravascular volume depletion despite LEE.  Continue to monitor with IVF.  Lower Extremity Edema:  No VTE on 1/17 (R leg), but [place] notable for R inguinal adenopathy as well as decreased venous resp phasicity suggesting central obstructing lesion "possibly related to bulky R pelvic and retroperitoneal adenopathy noted on PET-CT".  Suspect edema is most likely related to metastatic disease and poor venous return.  Normal BNP and CXR without edema suggests against HF (echo as above, normal EF, diastolic dysfunction).  Albumin is 2.9.  Will check UA for proteinuria (negative). [place] without evidence of DVT (limited study) Attempted ace wrap, but caused patient pain Trialed lasix and albumin yesterday, but no significant UOP and with positive orthostatics this morning, will continue IVF May be difficult to improve her LEE with likely cause due to poor venous return due to her metastatic disease.  Use of lasix limited with her intravascular depletion.   Acute Kidney Injury  Concern for volume depletion:  Baseline creatinine looks to be ~0.7.  Peaked to 1.3 on presentation, improved after IVF.    Had positive orthostatics, intravascularly depleted, but has impressive peripheral edema.   Renal US (with persistent R mod hydronephrosis, unchanged compared to prior PET CT) Orthostatics - positive again this morning.  Resume IVF  Metabolic Acidosis: VBG with resp alkalosis yesterday.  Normal  bicarb this morning.   Elevated Liver Enzymes:  Elevated AST, alk phos, and ALT.  Bili elevated this AM.  Possibly related to hypovolemia vs liver mets vs avelumab.  Alk phos likely related to boney mets.  Continue to follow Check hepatitis panel  (negative)  Rash: has a extensive rash over back and chest and face, blanching, maculopapular.  No oral involvement.  Seems to be worsening with involvement under ACE wraps and more prominent on face.    Start IV steroids  Topical hydrocortisone  Chronic Pain  Nausea: continue long acting oxycodone BID (checked PMP aware and I can't see where she filled this - see recent oxycodone short acting, but she notes she's not taking this anymore and also that she's taking 12 hr med, also noted in previous note from Dr. Marin Olp - will continue this for now).  Ativan prn nausea as well as prn zofran.  Muscle relaxer Prn oxy 5 added  Metastatic Merkel Cell Carcinoma: mets to lung, bone, and LN's.  Was on avelumab and xgeva, but has been declining over past few weeks as noted above.  Rash though due to avelumab.  Appreciate Dr. Antonieta Pert assistance.  Further therapy per Dr. Marin Olp.    Stage 1 Pressure ulcer: pt with foam dressing  DVT prophylaxis: heparin Code Status: full Family Communication: none at bedside Disposition Plan: pending   Consultants:   Dr. Marin Olp  Procedures:   Echo and LE [place] pending  Antimicrobials: Anti-infectives (From admission, onward)   None     Subjective: Feels better than yesterday.   Objective: Vitals:   02/22/17 1230 02/22/17 1444 02/22/17 2230 02/23/17 0700  BP: 124/70 (!) 112/55 (!) 101/57 127/81  Pulse: 90 94 93 99  Resp:  14 16 16   Temp:  98.2 F (36.8 C) 98 F (36.7 C) (!) 97.5 F (36.4 C)  TempSrc:  Oral Oral Oral  SpO2:  98% 99% 98%  Weight:    87.4 kg (192 lb 11.2 oz)  Height:        Intake/Output Summary (Last 24 hours) at 02/23/2017 0831 Last data filed at 02/23/2017 0600 Gross per 24 hour  Intake 1090 ml  Output 500 ml  Net 590 ml   Filed Weights   02/21/17 1652 02/22/17 0542 02/23/17 0700  Weight: 86 kg (189 lb 9.5 oz) 87.8 kg (193 lb 9.6 oz) 87.4 kg (192 lb 11.2 oz)    Examination:  General: No acute  distress. Cardiovascular: Heart sounds show a regular rate, and rhythm. No gallops or rubs. No murmurs. No JVD. Lungs: Good air movement, no increased wob. No rales, rhonchi or wheezes.  Minimal crackles at bases. Abdomen: Soft, nontender, nondistended with normal active bowel sounds. No masses. No hepatosplenomegaly. Neurological: Alert and oriented 3. Moves all extremities 4 with equal strength. Cranial nerves II through XII grossly intact. Skin: Warm and dry. Diffuse maculopapular rash to back, chest and face.  Some noted beneath ACE wraps on LE as well.  Extremities: No clubbing or cyanosis. R>L LEE. Pedal pulses 2+.  Data Reviewed: I have personally reviewed following labs and imaging studies  CBC: Recent Labs  Lab 02/21/17 0905 02/21/17 1950 02/22/17 0409 02/23/17 0353  WBC 12.4* 12.5* 12.2* 10.6*  NEUTROABS 10.5*  --   --   --   HGB  --  11.9* 11.1* 10.1*  HCT 36.1 35.3* 32.5* 29.7*  MCV 82.4 81.1 81.5 81.8  PLT 94* 89* 80* 72*   Basic Metabolic Panel: Recent Labs  Lab 02/21/17  7619 02/21/17 1950 02/22/17 0409 02/22/17 0803 02/22/17 1648 02/23/17 0353  NA 130  --  126* 128* 129* 130*  K 4.2  --  4.3 4.0 3.7 3.8  CL 93*  --  93* 95* 95* 95*  CO2 24  --  21* 20* 23 24  GLUCOSE 101  --  108* 151* 119* 113*  BUN 44*  --  43* 44* 40* 41*  CREATININE 1.30* 1.05* 0.93 0.86 0.76 0.84  CALCIUM 7.4*  --  6.7* 6.7* 7.4* 7.7*  MG  --   --   --   --   --  2.3   GFR: Estimated Creatinine Clearance: 68.1 mL/min (by C-G formula based on SCr of 0.84 mg/dL). Liver Function Tests: Recent Labs  Lab 02/21/17 0905 02/22/17 0409 02/23/17 0353  AST 96* 104* 93*  ALT 51* 44 44  ALKPHOS 491* 499* 491*  BILITOT 1.3 1.3* 1.8*  PROT 6.3* 5.5* 6.0*  ALBUMIN 2.9* 2.4* 3.3*   No results for input(s): LIPASE, AMYLASE in the last 168 hours. No results for input(s): AMMONIA in the last 168 hours. Coagulation Profile: No results for input(s): INR, PROTIME in the last 168  hours. Cardiac Enzymes: Recent Labs  Lab 02/21/17 1220 02/21/17 1950 02/22/17 0409 02/22/17 0803  TROPONINI 0.03* 0.04* 0.05* 0.04*   BNP (last 3 results) No results for input(s): PROBNP in the last 8760 hours. HbA1C: No results for input(s): HGBA1C in the last 72 hours. CBG: No results for input(s): GLUCAP in the last 168 hours. Lipid Profile: No results for input(s): CHOL, HDL, LDLCALC, TRIG, CHOLHDL, LDLDIRECT in the last 72 hours. Thyroid Function Tests: Recent Labs    02/21/17 0905  TSH 3.667   Anemia Panel: No results for input(s): VITAMINB12, FOLATE, FERRITIN, TIBC, IRON, RETICCTPCT in the last 72 hours. Sepsis Labs: No results for input(s): PROCALCITON, LATICACIDVEN in the last 168 hours.  No results found for this or any previous visit (from the past 240 hour(s)).       Radiology Studies: Dg Chest 2 View  Result Date: 02/21/2017 CLINICAL DATA:  Shortness of breath. EXAM: CHEST  2 VIEW COMPARISON:  02/14/2017 PET-CT 01/30/2017. FINDINGS: PowerPort catheter noted with tip over the superior vena cava. Heart size normal. Multiple pulmonary nodular densities are again noted consistent patient's known metastatic disease. Persistent mild bibasilar subsegmental atelectasis. Heart size normal. IMPRESSION: 1.  PowerPort catheter stable position. 2. Diffuse bilateral pulmonary nodular densities are again noted consistent with metastatic disease. Similar findings noted on prior exam. Mild bibasilar subsegmental atelectasis and/or scarring. Electronically Signed   By: Marcello Moores  Register   On: 02/21/2017 12:08   US Renal  Result Date: 02/22/2017 CLINICAL DATA:  Acute kidney injury. EXAM: RENAL / URINARY TRACT ULTRASOUND COMPLETE COMPARISON:  PET-CT, 01/30/2017 FINDINGS: Right Kidney: Length: 11.4 cm. Borderline increased renal parenchymal echogenicity. No renal masses or stones. Moderate hydronephrosis stable when compared to the prior PET-CT. Left Kidney: Length: 11.8 cm. Normal  parenchymal echogenicity. Two lower pole cysts, 1 measuring 2.2 x 1.6 x 2.6 cm the other 2.1 x 2.0 x 1.7 cm. No solid renal masses, no stones and no hydronephrosis. Bladder: Appears normal for degree of bladder distention. IMPRESSION: 1. Persistent right moderate hydronephrosis, unchanged when compared to the prior PET-CT. 2. No left hydronephrosis. 3. Two left renal cysts.  No other renal masses. Electronically Signed   By: Lajean Manes M.D.   On: 02/22/2017 09:25        Scheduled Meds: . feeding supplement (ENSURE  ENLIVE)  237 mL Oral BID BM  . megestrol  400 mg Oral BID  . oxyCODONE  15 mg Oral Q12H  . predniSONE  60 mg Oral Q breakfast   Continuous Infusions:    LOS: 0 days    Time spent: over 30 min    Fayrene Helper, MD [facility] Pager [phone]  If 7PM-7AM, please contact night-coverage [email or web address] Password [facility] 02/23/2017, 8:31 AM

## 2017-02-24 LAB — CBC
HEMATOCRIT: 30 % — AB (ref 36.0–46.0)
HEMOGLOBIN: 10.2 g/dL — AB (ref 12.0–15.0)
MCH: 27.9 pg (ref 26.0–34.0)
MCHC: 34 g/dL (ref 30.0–36.0)
MCV: 82.2 fL (ref 78.0–100.0)
Platelets: 72 10*3/uL — ABNORMAL LOW (ref 150–400)
RBC: 3.65 MIL/uL — AB (ref 3.87–5.11)
RDW: 17.8 % — AB (ref 11.5–15.5)
WBC: 13.1 10*3/uL — AB (ref 4.0–10.5)

## 2017-02-24 LAB — COMPREHENSIVE METABOLIC PANEL
ALBUMIN: 3 g/dL — AB (ref 3.5–5.0)
ALK PHOS: 590 U/L — AB (ref 38–126)
ALT: 46 U/L (ref 14–54)
AST: 116 U/L — AB (ref 15–41)
Anion gap: 8 (ref 5–15)
BILIRUBIN TOTAL: 1.7 mg/dL — AB (ref 0.3–1.2)
BUN: 41 mg/dL — AB (ref 6–20)
CALCIUM: 7.6 mg/dL — AB (ref 8.9–10.3)
CO2: 24 mmol/L (ref 22–32)
CREATININE: 0.76 mg/dL (ref 0.44–1.00)
Chloride: 98 mmol/L — ABNORMAL LOW (ref 101–111)
GFR calc Af Amer: >60 mL/min (ref >60)
GLUCOSE: 114 mg/dL — AB (ref 65–99)
Potassium: 4.7 mmol/L (ref 3.5–5.1)
Sodium: 130 mmol/L — ABNORMAL LOW (ref 135–145)
TOTAL PROTEIN: 5.9 g/dL — AB (ref 6.5–8.1)

## 2017-02-24 LAB — MAGNESIUM: MAGNESIUM: 2.3 mg/dL (ref 1.7–2.4)

## 2017-02-24 MED ORDER — ENSURE ENLIVE PO LIQD: 237.0000 mL | Freq: Two times a day (BID) | ORAL | 0 refills | Status: AC

## 2017-02-24 MED ORDER — SODIUM CHLORIDE 0.9 % IV BOLUS (SEPSIS)
500.0000 mL | Freq: Once | INTRAVENOUS | Status: AC
  Administered 2017-02-24: 500 mL via INTRAVENOUS

## 2017-02-24 MED ORDER — PREDNISONE 20 MG PO TABS
60.0000 mg | ORAL_TABLET | Freq: Every day | ORAL | Status: DC
  Administered 2017-02-24: 60 mg via ORAL
  Filled 2017-02-24: qty 3

## 2017-02-24 MED ORDER — PREDNISONE 20 MG PO TABS: ORAL_TABLET | ORAL | 0 refills | Status: AC

## 2017-02-24 MED ORDER — HEPARIN SOD (PORK) LOCK FLUSH 100 UNIT/ML IV SOLN
500.0000 [IU] | INTRAVENOUS | Status: AC | PRN
Start: 2017-02-24 — End: 2017-02-24
  Administered 2017-02-24: 500 [IU]

## 2017-02-24 NOTE — Discharge Summary (Signed)
Physician Discharge Summary  [name] [name] BJS:[id number] DOB: 1945-02-11 DOA: 02/21/2017  PCP: Abner Greenspan, MD  Admit date: 02/21/2017 Discharge date: 02/24/2017  Time spent: over 30 minutes  Recommendations for Outpatient Follow-up:  1. Follow up outpatient CBC/CMP  2. Follow rash and response to steroids 3. Follow blood pressure and orthostatics, improved at d/c, but still tachy 4. Follow up lower extremity edema 5. Follow up with Dr. Marin Olp for further management of merkel cell? Follow up options? Goals of care? 6. Follow up with Dr. Marin Olp regarding right sided hydronephrosis? Need for f/u with urology in setting of her metastatic disease? 7. 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  Hospital Course:  Failure to thrive  Decreased Appetite:Progressive generalized weakness over past few weeks, decreased PO intake. Likely related to her metastatic merkel cell. She started avelumab on 1/17 which could be contributing as well (notably both fatigue and peripheral edema noted as common side effects). She's had worsening peripheral edema as well and this is likely contributing as she has hard time walking because of this.  PT (home health PT, 24 hour supervision) Continue megace Nutrition c/s Follow up and treat below  Dyspnea on Exertion  Elevated Troponin: Pt with progressive DOE over the past few weeks. Sounds like this has worsened along with her lower extremity swelling. Suspect this could be 2/2 progressive metastatic disease or related to avelumab (fatigue and dyspnea are listed as common side effects).  She was mildly tachy on presentation, but satting well on room air throughout hopsitalization.  Considered CT PE protocol, but declined this in the ED.  She's declined this during the hospitalization as well.  LE [place] without evidence of DVT and echo normal with normal PA pressure, so PE probably less likely, but discussed with pt with malignancy, she's at higher  risk and the best way to rule this out is a PE protocol CT.  Pt declined this study after discussion.  Pt with mildly elevated, but flat troponin on presentation.  Never c/o chest pain.     Echo with normal EF, diastolic dysfunction CXR with diffuse bilateral pulmonary nodular densities c/w metastatic disease Repeat CXR on 2/2 with no interval change (known metastatic dz, no acute infiltrate) Trend troponin (mild, flat as noted above) Repeat EKG (sinus rhythm, short PR, no ST-T wave changes concerning for ischemia) Pt ambulated with nursing, 94% and maintained  Hyponatremia:  Improved (not sure if 126 was spurious as it improved throughout day without much intervention).  With low urine Na and positive orthostatics, likely 2/2 hypovolemia.  IVF throughout admission.  Lower Extremity Edema: No VTE on 1/17 (R leg), but [place] notable for R inguinal adenopathy as well as decreased venous resp phasicity suggesting central obstructing lesion "possibly related to bulky R pelvic and retroperitoneal adenopathy noted on PET-CT". Suspect edema  is most likely related to metastatic disease and poor venous return. Normal BNP and CXR without edema suggests against HF (echo as above, normal EF, diastolic dysfunction). Albumin is 2.9. Will check UA for proteinuria (negative). [place]  without evidence of DVT (limited study) Attempted ace wrap, but caused patient pain Trialed lasix and albumin yesterday, but no significant UOP and pt with positive orthostatics so will discontinue any further lasix May be difficult to improve her LEE with likely cause due to poor venous return due to her metastatic disease.  Use of lasix limited due to hypovolemia demonstrated by positive orthostatics.   Orthostatic Hypotension: intermittently orthostatic throughout admission.  Received IVF.  Gave bolus on day of discharge, pt orthostatics improved, still mild tachycardia, but pt preferred to discharge home.  Discussed standing slowly.  Change position slowly.  Stay hydrated.  Acute Kidney Injury Concern for volume depletion: Baseline creatinine looks to be ~0.7.  Peaked to 1.3 on presentation, improved after IVF.  Had positive orthostatics, intravascularly depleted, but has impressive peripheral edema.   Renal US (with persistent R mod hydronephrosis, unchanged compared to prior PET CT) - will need to follow up with Dr. Marin Olp regarding need to f/u with urology?  Metabolic Acidosis:  Normal bicarb this morning.   Elevated Liver Enzymes: Elevated AST, alk phos, and ALT.  Bili elevated this AM. Possibly related to hypovolemia vs liver mets vs avelumab. Alk phos likely related to boney mets.  Continue to follow Check hepatitis panel (negative)  Rash: has a extensive rash over back and chest and face, blanching, maculopapular. No oral involvement. Improved on day of discharge.  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 Was on avelumab and xgeva, but has been declining over past few weeks as noted above. Rash though due to avelumab. Appreciate Dr. Antonieta Pert assistance. Further therapy per Dr. Marin Olp.  Discussed today importance of follow up with Dr.  Marin Olp regarding goals of care, any further treatment options, or even consider other routes including hospice.    Stage 1 Pressure ulcer: pt with foam dressing  Procedures: Study Conclusions  - Left ventricle: The cavity size was normal. Systolic function was   normal. The estimated ejection fraction was in the range of 55%   to 60%. Wall motion was normal; there were no regional wall   motion abnormalities. Doppler parameters are consistent with   abnormal left ventricular relaxation (grade 1 diastolic   dysfunction). - Aortic valve: Transvalvular velocity was within the normal range.   There was no stenosis. There was moderate regurgitation. - Mitral valve: Transvalvular velocity was within the normal range.   There was no evidence for stenosis. There was no regurgitation. - Right ventricle: The cavity size was normal. Wall thickness was   normal. Systolic function was normal. - Atrial septum: No defect or patent foramen ovale was identified. - Tricuspid valve: There was trivial regurgitation. - Pulmonary arteries: Systolic pressure was within the normal   range. PA peak pressure: 19 mm Hg (S). - Pericardium, extracardiac: A trivial pericardial effusion was   identified.  Final Interpretation: Right: There is no evidence of deep vein thrombosis in the lower extremity. However, portions of this examination were limited- see technologist comments above. No cystic structure found in the popliteal fossa. Left: There is no evidence of deep vein thrombosis in the lower extremity. However, portions of this examination were limited- see technologist  comments above. No cystic structure found in the popliteal fossa. Consultations:  oncology  Discharge Exam: Vitals:   02/24/17 0623 02/24/17 1123  BP: 128/74 126/64  Pulse: 96 (!) 101  Resp: 18 14  Temp: (!) 97.5 F (36.4 C)   SpO2: 96%    Feels ok.  Better than yesterday.  Would like to go home today.  General: No acute  distress. Cardiovascular: Mildly tachy Heart sounds show a regular rate, and rhythm. No gallops or rubs. No murmurs. No JVD. Lungs: Clear to auscultation bilaterally with good air movement. No rales, rhonchi or wheezes. Abdomen: Soft, nontender, nondistended with normal active bowel sounds. No masses. No hepatosplenomegaly. Neurological: Alert and oriented 3. Moves all extremities 4. Cranial nerves II through XII grossly intact. Skin: Diffuse maculopapular rash to face, chest, back.  Improved to LE's today.  Extremities: No clubbing or cyanosis. R>L 3+ LEE.  Psychiatric: Mood and affect are normal. Insight and judgment are appropriate.  Discharge Instructions   Discharge Instructions    Call MD for:  difficulty breathing, headache or visual disturbances   Complete by:  As directed    Call MD for:  extreme fatigue   Complete by:  As directed    Call MD for:  persistant dizziness or light-headedness   Complete by:  As directed    Call MD for:  persistant nausea and vomiting   Complete by:  As directed    Call MD for:  severe uncontrolled pain   Complete by:  As directed    Call MD for:  temperature >100.4   Complete by:  As directed    Diet - low sodium heart healthy   Complete by:  As directed    Discharge instructions   Complete by:  As directed    You were seen for worsening lower extremity swelling and shortness of breath with exertion.  You do not have any blood clots in your legs.  Your swelling is likely due to the metastatic cancer in your pelvis.  We're limited in what we can do to improve this.  Try to keep your legs elevated, we tried ACE wraps here, but this caused you pain.  You had an echocardiogram here that was normal and your oxygen levels are normal on room air.  You were dehydrated here and responded well to fluids.  Continue to try to eat and drink and stay hydrated at home.  Return if you notice new, recurrent, or worsening symptoms.  Please follow up with Dr.  Marin Olp this week.  Please follow up with your PCP.  You should have repeat labs within the next few days.  I'll send you home on a steroid taper for your rash.  You can continue to use benadryl or topical hydrocortisone as needed.   Increase activity slowly   Complete by:  As directed      Allergies as of 02/24/2017      Reactions   Evista [raloxifene Hcl]    cramps   Fosamax [alendronate Sodium]    GI intolerance    Gabapentin    REACTION: nausea   Nsaids    REACTION: GI upset      Medication List    STOP taking these medications   methylPREDNISolone 4 MG Tbpk tablet Commonly known as:  MEDROL DOSEPAK     TAKE these medications   CALCIUM 600 + D PO Take 2 tablets by mouth daily.   cholecalciferol 1000 units tablet Commonly known as:  VITAMIN D  Take 5,000 Units by mouth daily.   feeding supplement (ENSURE ENLIVE) Liqd Take 237 mLs by mouth 2 (two) times daily between meals. Start taking on:  02/25/2017   lactulose 10 GM/15ML solution Commonly known as:  CHRONULAC Take 15 mLs (10 g total) by mouth every 4 (four) hours as needed for severe constipation.   lidocaine-prilocaine cream Commonly known as:  EMLA Apply to affected area once   LORazepam 0.5 MG tablet Commonly known as:  ATIVAN Take 1 tablet (0.5 mg total) by mouth every 6 (six) hours as needed (Nausea or vomiting).   megestrol 400 MG/10ML suspension Commonly known as:  MEGACE Take 10 mLs (400 mg total) by mouth 2 (two) times daily.   ondansetron 8 MG tablet Commonly known as:  ZOFRAN Take 1 tablet (8 mg total) by mouth 2 (two) times daily as needed (Nausea or vomiting).   OxyCODONE ER 13.5 MG C12a Commonly known as:  XTAMPZA ER Take 13.5 mg by mouth every 12 (twelve) hours.   Oxycodone HCl 10 MG Tabs Take 1 tablet (10 mg total) by mouth every 6 (six) hours as needed for severe pain.   predniSONE 20 MG tablet Commonly known as:  DELTASONE Take 3 tablets (60 mg total) by mouth daily with breakfast  for 2 days, THEN 2 tablets (40 mg total) daily with breakfast for 3 days, THEN 1.5 tablets (30 mg total) daily with breakfast for 3 days, THEN 1 tablet (20 mg total) daily with breakfast for 3 days, THEN 0.5 tablets (10 mg total) daily with breakfast for 3 days. Start taking on:  02/25/2017   tiZANidine 4 MG tablet Commonly known as:  ZANAFLEX Take 1 tablet (4 mg total) by mouth every 6 (six) hours as needed for muscle spasms. Caution of sedation   zolpidem 10 MG tablet Commonly known as:  AMBIEN TAKE 1 TABLET BY MOUTH AT BEDTIME AS NEEDED FOR SLEEP What changed:    how much to take  how to take this  when to take this  additional instructions      Allergies  Allergen Reactions  . Evista [Raloxifene Hcl]     cramps  . Fosamax [Alendronate Sodium]     GI intolerance   . Gabapentin     REACTION: nausea  . Nsaids     REACTION: GI upset   Follow-up Information    [facility].   Specialty:  [place] Why:  [place] will call to arrange initial visit Contact information: [address] [place] [address] [phone]        Abner Greenspan, MD Follow up.   Specialties:  Family Medicine, Radiology Contact information: [address] [place] [address] [phone]        Volanda Napoleon, MD Follow up.   Specialty:  Oncology Contact information: [place] [address] [phone]            The results of significant diagnostics from this hospitalization (including imaging, microbiology, ancillary and laboratory) are listed below for reference.    Significant Diagnostic Studies: Dg Chest 2 View  Result Date: 02/21/2017 CLINICAL DATA:  Shortness of breath. EXAM: CHEST  2 VIEW COMPARISON:  02/14/2017 PET-CT 01/30/2017. FINDINGS: PowerPort catheter noted with tip over the superior vena cava. Heart size normal. Multiple pulmonary nodular  densities are again noted consistent patient's known metastatic disease. Persistent mild bibasilar subsegmental atelectasis. Heart size normal. IMPRESSION:  1.  PowerPort catheter stable position. 2. Diffuse bilateral pulmonary nodular densities are again noted consistent with metastatic disease. Similar findings noted on prior exam. Mild bibasilar subsegmental atelectasis and/or scarring. Electronically Signed   By: Marcello Moores  Register   On: 02/21/2017 12:08   Dg Chest 2 View  Result Date: 02/14/2017 CLINICAL DATA:  Shortness of breath.  History of lung carcinoma EXAM: CHEST  2 VIEW COMPARISON:  PET-CT January 30, 2017 FINDINGS: Port-A-Cath tip is in the superior vena cava. No pneumothorax. There are nodular opacities throughout the lungs bilaterally consistent widespread metastatic disease. Most of the nodular lesions are subcentimeter. There is atelectatic change in left base. There is no frank consolidation. Heart size and pulmonary vascularity appear normal. No adenopathy is appreciable by radiography. No bone lesions are appreciable. IMPRESSION: Widespread pulmonary metastatic disease without consolidation. There is mild left base atelectasis. Port-A-Cath tip is in the superior vena cava. No pneumothorax. Electronically Signed   By: Lowella Grip III M.D.   On: 02/14/2017 11:00   US Renal  Result Date: 02/22/2017 CLINICAL DATA:  Acute kidney injury. EXAM: RENAL / URINARY TRACT ULTRASOUND COMPLETE COMPARISON:  PET-CT, 01/30/2017 FINDINGS: Right Kidney: Length: 11.4 cm. Borderline increased renal parenchymal echogenicity. No renal masses or stones. Moderate hydronephrosis stable when compared to the prior PET-CT. Left Kidney: Length: 11.8 cm. Normal parenchymal echogenicity. Two lower pole cysts, 1 measuring 2.2 x 1.6 x 2.6 cm the other 2.1 x 2.0 x 1.7 cm. No solid renal masses, no stones and no hydronephrosis. Bladder: Appears normal for degree of bladder distention. IMPRESSION: 1. Persistent right  moderate hydronephrosis, unchanged when compared to the prior PET-CT. 2. No left hydronephrosis. 3. Two left renal cysts.  No other renal masses. Electronically Signed   By: Lajean Manes M.D.   On: 02/22/2017 09:25   Nm Pet Image Initial (pi) Whole Body  Result Date: 01/30/2017 CLINICAL DATA:  Subsequent treatment strategy for Merkel cell carcinoma of the right lower extremity. EXAM: NUCLEAR MEDICINE PET WHOLE BODY TECHNIQUE: 12.8 mCi F-18 FDG was injected intravenously. Full-ring PET imaging was performed from the vertex to the feet after the radiotracer. CT data was obtained and used for attenuation correction and anatomic localization. FASTING BLOOD GLUCOSE:  Value: 111 mg/dl COMPARISON:  None. FINDINGS: HEAD/NECK A cluster of enlarged lymph nodes in the medial left supraclavicular region/thoracic inlet demonstrates hypermetabolism. Index 15 millimetres lymph node seen on image 104 of series 3 demonstrates SUV max = 9.4. CHEST Innumerable bilateral pulmonary nodules of varying size are identified. The larger nodules are hypermetabolic. Index 1.5 cm nodule in the left lower lobe on image 150 of series 3 demonstrates SUV max = 4.5. Metastatic lymphadenopathy in the mediastinum is identified. 2.5 cm short axis lymph node posterior to the carina demonstrates SUV max = 12.7. Hypermetabolic lymphadenopathy posterior to the descending thoracic aorta demonstrates SUV max = 10.1. Retrocrural metastatic lymphadenopathy is seen bilaterally. 17 mm short axis left retrocrural lymph node on image 187 demonstrates SUV max = 10.0. Atherosclerotic calcification is seen in the coronary arteries and thoracic aorta. No evidence for pericardial effusion. ABDOMEN/PELVIS Multiple hypermetabolic liver lesions are evident. 18 mm lesion in the medial segment of the left liver demonstrates SUV max = 11 0.0. Bulky hypermetabolic lymphadenopathy is identified in the retroperitoneal space of the abdomen. 5.6 x 3.9 cm nodal conglomeration,  likely incorporating the IVC (image 229 of series 3), demonstrates SUV max = 13.6. Bulky right pelvic sidewall lymphadenopathy is identified. A nodal conglomeration along the  right pelvic sidewall seen on image 265 measures 9.9 x 3.3 cm. This demonstrates SUV max = 12.8. Lymphadenopathy in the right groin measures 4.4 x 5.5 on image 286 with SUV max = 14.2. Mild right hydroureteronephrosis. Ureteral obstruction is at the level of the lymphadenopathy along the distal aspect of the abdominal aorta. Patient is status post cholecystectomy. There are numerous small mesenteric nodule showing low level hypermetabolism, compatible with metastatic disease. Diverticular changes are noted in the left colon. SKELETON Scattered areas of hypermetabolism identified in the bony anatomy, involving ribs, cervical spine, thoracic spine, lumbar spine and bony pelvis. Uptake is identified in each femur. Index lesion in the L4 vertebral body demonstrates SUV max = 6.6. EXTREMITIES No evidence for hypermetabolic soft tissue disease in the lower extremities distal to the right groin lymphadenopathy although there is bony involvement in both lower extremities. No soft tissue involvement is identified in either upper extremity although hypermetabolic disease is identified in each humerus. Soft tissue edema is identified in the right lower extremity diffusely. IMPRESSION: 1. Hypermetabolic metastatic lymphadenopathy in the left thoracic inlet, mediastinum, retrocrural space, abdominal retroperitoneum, right pelvic sidewall, and right groin. 2. Innumerable pulmonary nodules consistent with metastatic disease. Nodules large enough to be resolved on PET imaging are hypermetabolic. 3. Multiple large hypermetabolic liver metastases. 4. Multiple small mesenteric nodules compatible with metastatic involvement of the mesentery. 5. Diffuse metastatic bony involvement. 6. Diffuse edema right lower extremity. 7.  Aortic Atherosclerois (ICD10-170.0)  Electronically Signed   By: Misty Stanley M.D.   On: 01/30/2017 14:07   US Venous Img Lower Unilateral Right  Result Date: 02/07/2017 CLINICAL DATA:  Swelling. Merkel cell carcinoma. Bulky confluent adenopathy in the right pelvis and retroperitoneum on recent PET-CT. EXAM: RIGHT LOWER EXTREMITY VENOUS DOPPLER ULTRASOUND TECHNIQUE: Gray-scale sonography with compression, as well as color and duplex ultrasound, were performed to evaluate the deep venous system from the level of the common femoral vein through the popliteal and proximal calf veins. COMPARISON:  PET-CT 01/30/2017 FINDINGS: Normal compressibility of the common femoral, superficial femoral, and popliteal veins, as well as the proximal calf veins. No filling defects to suggest DVT on grayscale or color Doppler imaging. Doppler waveforms show normal direction of venous flow and response to augmentation. There is decreased respiratory phasicity diffusely however. Subcutaneous edema noted in the calf and ankle. Visualized segments of the saphenous venous system normal in caliber and compressibility. Complex adenopathy in the right inguinal region measuring 13.4 x 3.9 x 5.8 cm. Survey views of the contralateral common femoral vein are unremarkable. IMPRESSION: 1.  No evidence of  lower extremity deep vein thrombosis, right. 2. Bulky right inguinal adenopathy as seen on prior PET-CT. 3. Decreased venous respiratory phasicity in the right lower extremity suggesting central obstructing lesion, possibly related to the bulky right pelvic and retroperitoneal adenopathy noted on PET-CT. Electronically Signed   By: Lucrezia Europe M.D.   On: 02/07/2017 16:42   Ir US Guide Vasc Access Right  Result Date: 02/05/2017 INDICATION: 72 year old with Merkel cell carcinoma. EXAM: FLUOROSCOPIC AND ULTRASOUND GUIDED PLACEMENT OF A SUBCUTANEOUS PORT COMPARISON:  None. MEDICATIONS: Ancef 2 g; The antibiotic was administered within an appropriate time interval prior to skin  puncture. ANESTHESIA/SEDATION: Versed 3.0 mg IV; Fentanyl 100 mcg IV; Moderate Sedation Time:  24 minutes The patient was continuously monitored during the procedure by the interventional radiology nurse under my direct supervision. FLUOROSCOPY TIME:  24 seconds, 1 mGy COMPLICATIONS: None immediate. PROCEDURE: The procedure, risks, benefits, and alternatives were  explained to the patient. Questions regarding the procedure were encouraged and answered. The patient understands and consents to the procedure. Patient was placed supine on the interventional table. Ultrasound confirmed a patent right internal jugular vein. The right chest and neck were cleaned with a skin antiseptic and a sterile drape was placed. Maximal barrier sterile technique was utilized including caps, mask, sterile gowns, sterile gloves, sterile drape, hand hygiene and skin antiseptic. The right neck was anesthetized with 1% lidocaine. Small incision was made in the right neck with a blade. Micropuncture set was placed in the right internal jugular vein with ultrasound guidance. The micropuncture wire was used for measurement purposes. The right chest was anesthetized with 1% lidocaine with epinephrine. #15 blade was used to make an incision and a subcutaneous port pocket was formed. [place] was assembled. Subcutaneous tunnel was formed with a stiff tunneling device. The port catheter was brought through the subcutaneous tunnel. The port was placed in the subcutaneous pocket. The micropuncture set was exchanged for a peel-away sheath. The catheter was placed through the peel-away sheath and the tip was positioned at the superior cavoatrial junction. Catheter placement was confirmed with fluoroscopy. The port was accessed and flushed with heparinized saline. The port pocket was closed using two layers of absorbable sutures and Dermabond. The vein skin site was closed using a single layer of absorbable suture and Dermabond. Sterile  dressings were applied. Patient tolerated the procedure well without an immediate complication. Ultrasound and fluoroscopic images were taken and saved for this procedure. IMPRESSION: Placement of a subcutaneous Power Port. Catheter tip at the superior cavoatrial junction. Electronically Signed   By: Markus Daft M.D.   On: 02/05/2017 17:28   Dg Chest Port 1 View  Result Date: 02/23/2017 CLINICAL DATA:  Dyspnea on exertion. EXAM: PORTABLE CHEST 1 VIEW COMPARISON:  February 21, 2017 FINDINGS: Stable Port-A-Cath. The cardiomediastinal silhouette is stable. Subtle nodularity in the lungs was better assessed on previous CT imaging consistent with known metastatic disease. Mild bibasilar atelectasis. No acute infiltrate. No other acute abnormalities. IMPRESSION: No significant interval change. Electronically Signed   By: Dorise Bullion III M.D   On: 02/23/2017 10:00   Ir Cyndy Freeze Guide Port Insertion Right  Result Date: 02/05/2017 INDICATION: 72 year old with Merkel cell carcinoma. EXAM: FLUOROSCOPIC AND ULTRASOUND GUIDED PLACEMENT OF A SUBCUTANEOUS PORT COMPARISON:  None. MEDICATIONS: Ancef 2 g; The antibiotic was administered within an appropriate time interval prior to skin puncture. ANESTHESIA/SEDATION: Versed 3.0 mg IV; Fentanyl 100 mcg IV; Moderate Sedation Time:  24 minutes The patient was continuously monitored during the procedure by the interventional radiology nurse under my direct supervision. FLUOROSCOPY TIME:  24 seconds, 1 mGy COMPLICATIONS: None immediate. PROCEDURE: The procedure, risks, benefits, and alternatives were explained to the patient. Questions regarding the procedure were encouraged and answered. The patient understands and consents to the procedure. Patient was placed supine on the interventional table. Ultrasound confirmed a patent right internal jugular vein. The right chest and neck were cleaned with a skin antiseptic and a sterile drape was placed. Maximal barrier sterile technique was  utilized including caps, mask, sterile gowns, sterile gloves, sterile drape, hand hygiene and skin antiseptic. The right neck was anesthetized with 1% lidocaine. Small incision was made in the right neck with a blade. Micropuncture set was placed in the right internal jugular vein with ultrasound guidance. The micropuncture wire was used for measurement purposes. The right chest was anesthetized with 1% lidocaine with epinephrine. #15 blade was  used to make an incision and a subcutaneous port pocket was formed. [place] was assembled. Subcutaneous tunnel was formed with a stiff tunneling device. The port catheter was brought through the subcutaneous tunnel. The port was placed in the subcutaneous pocket. The micropuncture set was exchanged for a peel-away sheath. The catheter was placed through the peel-away sheath and the tip was positioned at the superior cavoatrial junction. Catheter placement was confirmed with fluoroscopy. The port was accessed and flushed with heparinized saline. The port pocket was closed using two layers of absorbable sutures and Dermabond. The vein skin site was closed using a single layer of absorbable suture and Dermabond. Sterile dressings were applied. Patient tolerated the procedure well without an immediate complication. Ultrasound and fluoroscopic images were taken and saved for this procedure. IMPRESSION: Placement of a subcutaneous Power Port. Catheter tip at the superior cavoatrial junction. Electronically Signed   By: Markus Daft M.D.   On: 02/05/2017 17:28    Microbiology: No results found for this or any previous visit (from the past 240 hour(s)).   Labs: Basic Metabolic Panel: Recent Labs  Lab 02/22/17 0409 02/22/17 0803 02/22/17 1648 02/23/17 0353 02/24/17 0333  NA 126* 128* 129* 130* 130*  K 4.3 4.0 3.7 3.8 4.7  CL 93* 95* 95* 95* 98*  CO2 21* 20* 23 24 24   GLUCOSE 108* 151* 119* 113* 114*  BUN 43* 44* 40* 41* 41*  CREATININE 0.93 0.86 0.76  0.84 0.76  CALCIUM 6.7* 6.7* 7.4* 7.7* 7.6*  MG  --   --   --  2.3 2.3   Liver Function Tests: Recent Labs  Lab 02/21/17 0905 02/22/17 0409 02/23/17 0353 02/24/17 0333  AST 96* 104* 93* 116*  ALT 51* 44 44 46  ALKPHOS 491* 499* 491* 590*  BILITOT 1.3 1.3* 1.8* 1.7*  PROT 6.3* 5.5* 6.0* 5.9*  ALBUMIN 2.9* 2.4* 3.3* 3.0*   No results for input(s): LIPASE, AMYLASE in the last 168 hours. No results for input(s): AMMONIA in the last 168 hours. CBC: Recent Labs  Lab 02/21/17 0905 02/21/17 1950 02/22/17 0409 02/23/17 0353 02/24/17 0333  WBC 12.4* 12.5* 12.2* 10.6* 13.1*  NEUTROABS 10.5*  --   --   --   --   HGB  --  11.9* 11.1* 10.1* 10.2*  HCT 36.1 35.3* 32.5* 29.7* 30.0*  MCV 82.4 81.1 81.5 81.8 82.2  PLT 94* 89* 80* 72* 72*   Cardiac Enzymes: Recent Labs  Lab 02/21/17 1220 02/21/17 1950 02/22/17 0409 02/22/17 0803  TROPONINI 0.03* 0.04* 0.05* 0.04*   BNP: BNP (last 3 results) Recent Labs    02/21/17 1220  BNP 76.4    ProBNP (last 3 results) No results for input(s): PROBNP in the last 8760 hours.  CBG: No results for input(s): GLUCAP in the last 168 hours.     Signed:  Fayrene Helper MD.  [facility] 02/24/2017, 7:05 PM

## 2017-02-24 NOTE — Progress Notes (Signed)
Notified [facility] of scheduled dc home today with HHPT. Jonnie Finner RN CCM Case Mgmt phone [phone]

## 2017-02-24 NOTE — Progress Notes (Signed)
Patient and family given discharge, follow up, and medication instructions, verbalized understanding, IV team deaccessed port, telemetry removed, family to transport home

## 2017-02-25 LAB — GLUCOSE, CAPILLARY: GLUCOSE-CAPILLARY: 147 mg/dL — AB (ref 65–99)

## 2017-02-26 ENCOUNTER — Telehealth: Payer: Self-pay | Admitting: *Deleted

## 2017-02-26 MED ORDER — IPRATROPIUM-ALBUTEROL 20-100 MCG/ACT IN AERS: 1.0000 | INHALATION_SPRAY | Freq: Four times a day (QID) | RESPIRATORY_TRACT | 3 refills | Status: AC

## 2017-02-26 NOTE — Telephone Encounter (Signed)
Patient's husband calling to notify the office that she's having difficulty with breathing. He feels like the patient would benefit from an inhaler. She has an appointment scheduled this week for Thursday afternoon.  Patient's husband states the SOB is not urgent or emergent. She gets SOB with activity. He doesn't want to bring her to the doctors office or the ED. He feels like an inhaler will take care of the issue.   Dr Marin Olp will send in a prescription for an inhaler. Husband is aware of new prescription and pharmacy confirmed. They will come in on Thursday.

## 2017-02-27 ENCOUNTER — Telehealth: Payer: Self-pay | Admitting: *Deleted

## 2017-02-27 NOTE — Telephone Encounter (Signed)
Patient's husband, Patrick Jupiter notifying the office that patient is rapidly declining. She is barely alert and "talking out of her head". He states her eyes are rolled back in her head and her eyes are milky. She is also very short of breath. He expresses "I though we lost her a few times last night". He is at home with the patient and his daughter and son in law.   Instructed husband to take patient to the ED as it sounded like patient may be actively dying. Patrick Jupiter stated that patient did not want to go to the ED and it was her choice to die at home. He asked for hospice to come out, today if possible.  Spoke to Dr Marin Olp. He would like to proceed with a hospice consult.   Called and spoke to Amy at [facility]. They will attempt to see patient today.

## 2017-02-28 ENCOUNTER — Other Ambulatory Visit: Payer: Medicare Other

## 2017-02-28 ENCOUNTER — Ambulatory Visit: Payer: Medicare Other | Admitting: Hematology & Oncology

## 2017-03-01 ENCOUNTER — Telehealth: Payer: Self-pay | Admitting: *Deleted

## 2017-03-01 ENCOUNTER — Encounter: Payer: Self-pay | Admitting: Hematology & Oncology

## 2017-03-01 NOTE — Telephone Encounter (Signed)
Received notification that patient passed away at home on 2017/03/05 @ 7:17am

## 2017-03-07 ENCOUNTER — Other Ambulatory Visit: Payer: Medicare Other

## 2017-03-07 ENCOUNTER — Ambulatory Visit: Payer: Medicare Other

## 2017-03-21 ENCOUNTER — Other Ambulatory Visit: Payer: Medicare Other

## 2017-03-21 ENCOUNTER — Ambulatory Visit: Payer: Medicare Other | Admitting: Hematology & Oncology

## 2017-03-21 ENCOUNTER — Ambulatory Visit: Payer: Medicare Other

## 2017-03-22 DEATH — deceased

## 2017-04-04 ENCOUNTER — Other Ambulatory Visit: Payer: Self-pay | Admitting: Nurse Practitioner

## 2017-04-30 ENCOUNTER — Encounter: Payer: Medicare Other | Admitting: Family Medicine

## 2017-04-30 ENCOUNTER — Other Ambulatory Visit: Payer: Medicare Other

## 2019-02-23 IMAGING — CT NM PET TUM IMG INITIAL (PI) WHOLE BODY
1 of 10 series · 2 of 25 positions shown · non-contrast
Comparison: None.

CLINICAL DATA: Subsequent treatment strategy for Jaisson cell
carcinoma of the right lower extremity.

EXAM:
NUCLEAR MEDICINE PET WHOLE BODY
TECHNIQUE: 12.8 mCi F-18 FDG was injected intravenously. Full-ring PET imaging
was performed from the vertex to the feet after the radiotracer. CT
data was obtained and used for attenuation correction and anatomic
localization.
FASTING BLOOD GLUCOSE:  Value: 111 mg/dl

[Series 3: ct wb 5.0 b30f · axial · 5.0mm · 0.98mm/px · z∈[-1378,-776]mm · 2 of 603 slices shown]
[im 201/603  soft-tissue]
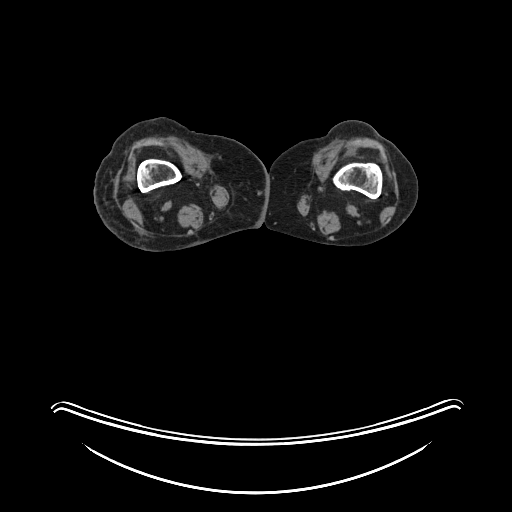
[im 402/603  soft-tissue]
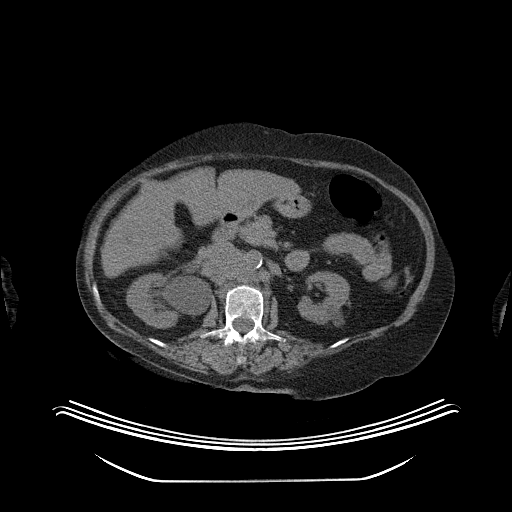

[2 of 25 positions shown; findings below may reference images not displayed]

FINDINGS: HEAD/NECK

A cluster of enlarged lymph nodes in the medial left supraclavicular
region/thoracic inlet demonstrates hypermetabolism. Index 15
millimetres lymph node seen on image 104 of series 3 demonstrates
SUV max = 9.4.

CHEST

Innumerable bilateral pulmonary nodules of varying size are
identified. The larger nodules are hypermetabolic. Index 1.5 cm
nodule in the left lower lobe on image 150 of series 3 demonstrates
SUV max = 4.5.

Metastatic lymphadenopathy in the mediastinum is identified. 2.5 cm
short axis lymph node posterior to the carina demonstrates SUV max =
12.7. Hypermetabolic lymphadenopathy posterior to the descending
thoracic aorta demonstrates SUV max = 10.1.

Retrocrural metastatic lymphadenopathy is seen bilaterally. 17 mm
short axis left retrocrural lymph node on image 187 demonstrates SUV
max = 10.0.

Atherosclerotic calcification is seen in the coronary arteries and
thoracic aorta. No evidence for pericardial effusion.

ABDOMEN/PELVIS

Multiple hypermetabolic liver lesions are evident. 18 mm lesion in
the medial segment of the left liver demonstrates SUV max = 11 0.0.

Bulky hypermetabolic lymphadenopathy is identified in the
retroperitoneal space of the abdomen. [DATE] x 3.9 cm nodal
conglomeration, likely incorporating the IVC (image 229 of series
3), demonstrates SUV max = 13.6.

Bulky right pelvic sidewall lymphadenopathy is identified. A nodal
conglomeration along the right pelvic sidewall seen on image 265
measures 9.9 x 3.3 cm. This demonstrates SUV max = 12.8.
Lymphadenopathy in the right groin measures 4.4 x 5.5 on image 286
with SUV max = 14.2.

Mild right hydroureteronephrosis. Ureteral obstruction is at the
level of the lymphadenopathy along the distal aspect of the
abdominal aorta. Patient is status post cholecystectomy. There are
numerous small mesenteric nodule showing low level hypermetabolism,
compatible with metastatic disease. Diverticular changes are noted
in the left colon.

SKELETON

Scattered areas of hypermetabolism identified in the bony anatomy,
involving ribs, cervical spine, thoracic spine, lumbar spine and
bony pelvis. Uptake is identified in each femur. Index lesion in the
L4 vertebral body demonstrates SUV max = 6.6.

EXTREMITIES

No evidence for hypermetabolic soft tissue disease in the lower
extremities distal to the right groin lymphadenopathy although there
is bony involvement in both lower extremities. No soft tissue
involvement is identified in either upper extremity although
hypermetabolic disease is identified in each humerus..

Soft tissue edema is identified in the right lower extremity
diffusely.
IMPRESSION: 1. Hypermetabolic metastatic lymphadenopathy in the left thoracic
inlet, mediastinum, retrocrural space, abdominal retroperitoneum,
right pelvic sidewall, and right groin.
2. Innumerable pulmonary nodules consistent with metastatic disease.
Nodules large enough to be resolved on PET imaging are
hypermetabolic.
3. Multiple large hypermetabolic liver metastases.
4. Multiple small mesenteric nodules compatible with metastatic
involvement of the mesentery.
5. Diffuse metastatic bony involvement.
6. Diffuse edema right lower extremity.
7.  Aortic Atherosclerois ([id number])
